# Patient Record
Sex: Male | Born: 1990 | Race: White | Hispanic: No | Marital: Married | Smoking: Former smoker
Health system: Southern US, Community
[De-identification: ages and names within clinical notes are randomized; demographics above are authoritative.]

## PROBLEM LIST (undated history)

## (undated) DIAGNOSIS — F909 Attention-deficit hyperactivity disorder, unspecified type: Secondary | ICD-10-CM

## (undated) HISTORY — PX: NO PAST SURGERIES: SHX2092

---
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
## 2011-08-06 ENCOUNTER — Ambulatory Visit (INDEPENDENT_AMBULATORY_CARE_PROVIDER_SITE_OTHER): Payer: Self-pay | Admitting: Internal Medicine

## 2011-08-06 DIAGNOSIS — F988 Other specified behavioral and emotional disorders with onset usually occurring in childhood and adolescence: Secondary | ICD-10-CM

## 2011-08-13 ENCOUNTER — Ambulatory Visit: Payer: Self-pay | Admitting: Internal Medicine

## 2011-09-06 ENCOUNTER — Ambulatory Visit (INDEPENDENT_AMBULATORY_CARE_PROVIDER_SITE_OTHER): Payer: Managed Care, Other (non HMO)

## 2011-09-06 DIAGNOSIS — R059 Cough, unspecified: Secondary | ICD-10-CM

## 2011-09-06 DIAGNOSIS — R509 Fever, unspecified: Secondary | ICD-10-CM

## 2011-09-06 DIAGNOSIS — R05 Cough: Secondary | ICD-10-CM

## 2012-01-28 ENCOUNTER — Telehealth: Payer: Self-pay

## 2012-01-28 DIAGNOSIS — F909 Attention-deficit hyperactivity disorder, unspecified type: Secondary | ICD-10-CM

## 2012-01-28 NOTE — Telephone Encounter (Signed)
Pt is requesting adderall refill Dr. Merla Riches

## 2012-01-28 NOTE — Telephone Encounter (Signed)
Please pull chart and route to PA for review.

## 2012-01-29 MED ORDER — AMPHETAMINE-DEXTROAMPHETAMINE 15 MG PO TABS: 15.0000 mg | ORAL_TABLET | Freq: Three times a day (TID) | ORAL | Status: DC

## 2012-01-29 NOTE — Telephone Encounter (Signed)
Per Dr. Netta Corrigan last note in 12/13, he wanted pt to call or RTC in 3  Months.  What's plan?

## 2012-01-29 NOTE — Telephone Encounter (Signed)
LMOM that his request if ready for p/up

## 2012-01-29 NOTE — Telephone Encounter (Signed)
Pt stated that he didn't even think about needing a f/up, because he is finally on a combination that seems to be working pretty well for him. He is working two jobs this summer and it is very hard to get in to see Dr Merla Riches right now, but he does get off at 1:00 pm on Weds. I transferred him to appt center to set up appt for next Wed PM appt w/ Dr Merla Riches. Pt does request RFs of Adderall until his appt if possible, and would like to try increasing strength to 20 mg TID instead of 10 mg. The dosing schedule seems to work well for him, but he feels like a slightly higher strength would help him more. Do you want to do this, Dr Merla Riches? Checked EPIC and appt is set for 7/3 @ 2 pm.

## 2012-01-29 NOTE — Telephone Encounter (Signed)
Will increase to 15 mg 3 times a day of regular release Adderall with next appointment on 02/25/2012

## 2012-01-30 ENCOUNTER — Telehealth: Payer: Self-pay

## 2012-01-30 NOTE — Telephone Encounter (Signed)
Patient wants to know if any one has been in contact with his insurance company because they gave him a hard time the last time he tried to get his script filled and if the dosage of his adderall has been increased.

## 2012-01-30 NOTE — Telephone Encounter (Signed)
Spoke with patient, notified that yes... We did increase his Adderall to 15 tid.  He will pick up rx Sunday.  Also let him know that we do not know if we will have to precert new dose yet, it will have to be run through the pharmacy first and then they will contact us.  Pt understood.

## 2012-02-03 ENCOUNTER — Telehealth: Payer: Self-pay

## 2012-02-03 NOTE — Telephone Encounter (Signed)
PT STATES HE HASN'T BEEN ABLE TO GET ALL HIS ADDERALL BECAUSE WE NEED TO CALL THE INSURANCE COMPANY TO AUTHORIZED AN EARLY REFILL PT IS HAVING CLASS THIS EVENING AND REALLY NEED TO TAKE HIS ADDERALL TODAY IF POSSIBLE PLEASE CALL PT AT [phone]   [facility] ON [address]

## 2012-02-04 ENCOUNTER — Telehealth: Payer: Self-pay

## 2012-02-04 NOTE — Telephone Encounter (Addendum)
PATIENT'S MOTHER WANTS TO KNOW IF WE HAVE SENT IN THE NECESSARY PAPERWORK SAYING WHY HER SON HAS TO HAVE 3 DOSES OF ADDDERALL 15MG  INSTEAD ON 2 DOSES? PLEASE CALL TO LET HER KNOW. BEST PHONE [phone] (MOTHER IS KATHY Calvey)   (PHARMACY IS [facility])    MBC

## 2012-02-04 NOTE — Telephone Encounter (Signed)
Called pharmacy and got info to do PA. [employer] did not have a record of pt under that ID #. Called pt and got updated ID #[id number] and cust serv #. Called and completed PA over the phone and received approval for 3 mos only #[id number]. Asked if we would need another PA if strength increased to 20 mg and was told that we will. Notified pt of all of the above. Pt agreed and will call pharmacy to have it filled.

## 2012-02-25 ENCOUNTER — Ambulatory Visit (INDEPENDENT_AMBULATORY_CARE_PROVIDER_SITE_OTHER): Payer: Managed Care, Other (non HMO) | Admitting: Internal Medicine

## 2012-02-25 VITALS — BP 121/66 | HR 65 | Temp 97.9°F | Resp 16 | Ht 70.0 in | Wt 169.9 lb

## 2012-02-25 DIAGNOSIS — F988 Other specified behavioral and emotional disorders with onset usually occurring in childhood and adolescence: Secondary | ICD-10-CM

## 2012-02-25 DIAGNOSIS — F909 Attention-deficit hyperactivity disorder, unspecified type: Secondary | ICD-10-CM | POA: Insufficient documentation

## 2012-02-25 MED ORDER — AMPHETAMINE-DEXTROAMPHETAMINE 20 MG PO TABS: 20.0000 mg | ORAL_TABLET | Freq: Three times a day (TID) | ORAL | Status: DC

## 2012-02-25 NOTE — Progress Notes (Signed)
  Subjective:    Patient ID: Matthew Hamilton, male    DOB: [birth year], 21 y.o.   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 amphetamine-dextroamphetamine (ADDERALL) 20 MG tablet    Sig: Take 1 tablet (20 mg total) by mouth 3 (three) times daily.    Dispense:  90 tablet    Refill:  0  . amphetamine-dextroamphetamine (ADDERALL) 20 MG tablet    Sig: Take 1 tablet (20 mg total) by mouth 3 (three) times daily.    Dispense:  90 tablet    Refill:  0  . amphetamine-dextroamphetamine (ADDERALL) 20 MG tablet    Sig: Take 1 tablet (20 mg total) by mouth 3 (three) times daily.    Dispense:  90 tablet    Refill:  0   Call in 3 months for another 3 prescriptions and followup in 6 months Phone number for [facility] precertification [phone]

## 2012-04-11 ENCOUNTER — Ambulatory Visit (INDEPENDENT_AMBULATORY_CARE_PROVIDER_SITE_OTHER): Payer: Managed Care, Other (non HMO) | Admitting: Internal Medicine

## 2012-04-11 VITALS — BP 108/80 | HR 60 | Temp 97.6°F | Resp 16 | Ht 70.0 in | Wt 168.4 lb

## 2012-04-11 DIAGNOSIS — J029 Acute pharyngitis, unspecified: Secondary | ICD-10-CM

## 2012-04-11 LAB — POCT RAPID STREP A (OFFICE): Rapid Strep A Screen: NEGATIVE

## 2012-04-11 MED ORDER — CEPHALEXIN 500 MG PO CAPS: 500.0000 mg | ORAL_CAPSULE | Freq: Three times a day (TID) | ORAL | Status: DC

## 2012-04-11 NOTE — Progress Notes (Signed)
  Subjective:    Patient ID: Matthew Hamilton, male    DOB: 07/11/1991, 21 y.o.   MRN: [id number]  HPIPresents with 24-hour history of fever and pharyngitis No runny nose/no cough    Review of Systems     Objective:   Physical Exam TMs clear/nares clear Throat red with exudate 1+ a.c. Nodes Chest clear       Results for orders placed in visit on 04/11/12  POCT RAPID STREP A (OFFICE)      Component Value Range   Rapid Strep A Screen Negative  Negative    Assessment & Plan:  Problem #1 pharyngitis Throat culture Start Z-Pak

## 2012-04-13 LAB — CULTURE, GROUP A STREP: Organism ID, Bacteria: NORMAL

## 2012-04-13 NOTE — Telephone Encounter (Signed)
The only thing we can do about an early refill would be for him to use a prescription without using his insurance If his throat is still sore tell him to continue the antibiotic and we can recheck him this weekend on Sunday if he is not well- in the early morning

## 2012-04-13 NOTE — Telephone Encounter (Signed)
Pt states that he still has a bad ST, but no fever. Please advice

## 2012-04-14 NOTE — Telephone Encounter (Signed)
I have advised patient and he will try ibuprofen as well, he has not tried this yet.

## 2012-04-14 NOTE — Telephone Encounter (Signed)
This was attatched to an old message, he does not need early refill. He is asking about his sore throat , I have left message for him to call me back about this.

## 2012-04-15 ENCOUNTER — Telehealth: Payer: Self-pay

## 2012-04-15 NOTE — Telephone Encounter (Signed)
Dr. Merla Riches, patient mother would like for you to give her a call about her son. Didn't give any details. [phone].

## 2012-04-16 NOTE — Telephone Encounter (Signed)
Still has pain is sharp in the left posterior pharynx No fever fatigue No nodes Discussed coxsackievirus as probable etiology If not well within 10-14 days we'll set up nasendoscopy by ENT

## 2012-09-15 ENCOUNTER — Telehealth: Payer: Self-pay

## 2012-09-15 DIAGNOSIS — F988 Other specified behavioral and emotional disorders with onset usually occurring in childhood and adolescence: Secondary | ICD-10-CM

## 2012-09-15 MED ORDER — AMPHETAMINE-DEXTROAMPHETAMINE 20 MG PO TABS: 20.0000 mg | ORAL_TABLET | Freq: Three times a day (TID) | ORAL | Status: DC

## 2012-09-15 NOTE — Telephone Encounter (Signed)
Notified mother that rx's were ready and that pt needs to be seen before further refills.

## 2012-09-15 NOTE — Telephone Encounter (Signed)
Doing well w/out side effects  Meds ordered this encounter  Medications  . amphetamine-dextroamphetamine (ADDERALL) 20 MG tablet    Sig: Take 1 tablet (20 mg total) by mouth 3 (three) times daily. For after 11/13/12    Dispense:  90 tablet    Refill:  0  . amphetamine-dextroamphetamine (ADDERALL) 20 MG tablet    Sig: Take 1 tablet (20 mg total) by mouth 3 (three) times daily. For after 10/16/12    Dispense:  90 tablet    Refill:  0  . amphetamine-dextroamphetamine (ADDERALL) 20 MG tablet    Sig: Take 1 tablet (20 mg total) by mouth 3 (three) times daily.    Dispense:  90 tablet    Refill:  0   Needs f/u before further ref

## 2012-09-15 NOTE — Telephone Encounter (Signed)
Patient's mother would like adderall refill for 3 months that Dr Merla Riches normally does for him.  Best # when ready [phone]

## 2012-10-03 ENCOUNTER — Telehealth: Payer: Self-pay

## 2012-10-03 NOTE — Telephone Encounter (Signed)
I called [facility] and they said we would have to write another script for 10 mg. They do have them in stock now and they also have 30 mgs. Please advise.

## 2012-10-03 NOTE — Telephone Encounter (Signed)
Mom reports there is a shortage on amphetamine-dextroamphetamine (ADDERALL) 20 MG tablet Will check around with various pharmacies; is asking if Dr. Merla Riches will consider rewriting the script for 10 MG to be taken 6 x day or 30 mg to take two x daily  Cbn:  [phone]

## 2012-10-04 MED ORDER — AMPHETAMINE-DEXTROAMPHETAMINE 10 MG PO TABS: 20.0000 mg | ORAL_TABLET | Freq: Three times a day (TID) | ORAL | Status: DC

## 2012-10-04 NOTE — Telephone Encounter (Signed)
Called him to advise Rx at front desk for pick up.

## 2012-10-04 NOTE — Telephone Encounter (Signed)
20mg  unavailable Meds ordered this encounter  Medications  . amphetamine-dextroamphetamine (ADDERALL) 10 MG tablet    Sig: Take 2 tablets (20 mg total) by mouth 3 (three) times daily.    Dispense:  180 tablet    Refill:  0   Has 20 tid for 2/22 and 3/22 if they become available

## 2013-04-05 ENCOUNTER — Telehealth: Payer: Self-pay

## 2013-04-05 NOTE — Telephone Encounter (Signed)
Spoke to mother, she states Rx was written for Adderall and Rx was expired when she took it in to pharmacy. Advised her appt is needed for him has been a year since last visit. She is asking for one month supply please advise.

## 2013-04-05 NOTE — Telephone Encounter (Signed)
Called mother, to see if there is anything further I can add to this note. Left message for her to call me back.

## 2013-04-05 NOTE — Telephone Encounter (Signed)
KATHY WOULD LIKE A CALL BACK FROM DR DOOLITTLE REGARDING HER SON WHEN HE COMES BACK PLEASE CALL [phone]

## 2013-04-06 NOTE — Telephone Encounter (Signed)
Federal regulations have [place] trapped here//can't write without a visit within the year

## 2013-04-07 ENCOUNTER — Telehealth: Payer: Self-pay

## 2013-04-07 NOTE — Telephone Encounter (Signed)
Patient's mom returned call. Please try mom again tomorrow [phone] If between 9-1 call her at work [phone] ext [phone]

## 2013-04-07 NOTE — Telephone Encounter (Signed)
Thanks. I have called mother to advise.  Left message for her to call me back.

## 2013-04-08 NOTE — Telephone Encounter (Signed)
I have called her to advise.  

## 2013-04-08 NOTE — Telephone Encounter (Signed)
Called mother to advise. Left message for her to call me back./

## 2013-05-04 ENCOUNTER — Ambulatory Visit: Payer: Managed Care, Other (non HMO) | Admitting: Internal Medicine

## 2013-05-25 ENCOUNTER — Ambulatory Visit: Payer: Managed Care, Other (non HMO) | Admitting: Internal Medicine

## 2013-06-29 ENCOUNTER — Encounter: Payer: Self-pay | Admitting: Internal Medicine

## 2013-06-29 ENCOUNTER — Ambulatory Visit (INDEPENDENT_AMBULATORY_CARE_PROVIDER_SITE_OTHER): Payer: BC Managed Care – PPO | Admitting: Internal Medicine

## 2013-06-29 VITALS — BP 128/84 | HR 76 | Temp 99.0°F | Resp 17 | Ht 69.0 in | Wt 166.0 lb

## 2013-06-29 DIAGNOSIS — F17200 Nicotine dependence, unspecified, uncomplicated: Secondary | ICD-10-CM

## 2013-06-29 DIAGNOSIS — F988 Other specified behavioral and emotional disorders with onset usually occurring in childhood and adolescence: Secondary | ICD-10-CM

## 2013-06-29 MED ORDER — AMPHETAMINE-DEXTROAMPHETAMINE 20 MG PO TABS: 20.0000 mg | ORAL_TABLET | Freq: Three times a day (TID) | ORAL | Status: DC

## 2013-06-29 NOTE — Progress Notes (Signed)
  Subjective:    Patient ID: Matthew Hamilton, male    DOB: 04/28/1991, 22 y.o.   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 Constitutional: He is oriented to person, place, and time. He appears well-developed and well-nourished. No distress.  HENT:  Head: Normocephalic and atraumatic.  Eyes: Pupils are equal, round, and reactive to light.  Neck: Normal range of motion.  Cardiovascular: Normal rate and regular rhythm.   Pulmonary/Chest: Effort normal. No respiratory distress.  Musculoskeletal: Normal range of motion.  Neurological: He is alert and oriented to person, place, and time.  Skin: Skin is warm and dry.  Psychiatric: He has a normal mood and affect. His behavior is normal.   BP 128/84  Pulse 76  Temp(Src) 99 F (37.2 C) (Oral)  Resp 17  Ht 5\' 9"  (1.753 m)  Wt 166 lb (75.297 kg)  BMI 24.50 kg/m2  SpO2 98%        Assessment & Plan:  ADD (attention deficit disorder) - Plan: amphetamine-dextroamphetamine (ADDERALL) 20 MG tablet, amphetamine-dextroamphetamine (ADDERALL) 20 MG tablet  Smoker  Meds ordered this encounter  Medications  . amphetamine-dextroamphetamine (ADDERALL) 20 MG tablet    Sig: Take 1 tablet (20 mg total) by mouth 3 (three) times daily. For after 10/16/12    Dispense:  90 tablet    Refill:  0  . amphetamine-dextroamphetamine (ADDERALL) 20 MG tablet    Sig: Take 1 tablet (20 mg total) by mouth 3 (three) times daily. For after 11/13/12    Dispense:  90 tablet    Refill:  0  . amphetamine-dextroamphetamine (ADDERALL) 20 MG tablet    Sig: Take 1 tablet (20 mg total) by mouth 3 (three) times daily.    Dispense:  90 tablet    Refill:  0   Call 3/f-u 6

## 2013-07-01 ENCOUNTER — Encounter: Payer: Self-pay | Admitting: Internal Medicine

## 2013-07-01 DIAGNOSIS — Z87891 Personal history of nicotine dependence: Secondary | ICD-10-CM | POA: Insufficient documentation

## 2013-10-23 ENCOUNTER — Encounter (HOSPITAL_COMMUNITY): Payer: Self-pay

## 2013-10-23 ENCOUNTER — Ambulatory Visit (HOSPITAL_COMMUNITY)
Admission: RE | Admit: 2013-10-23 | Discharge: 2013-10-23 | Disposition: A | Discharge status: Home / Self Care | Payer: BC Managed Care – PPO | Source: Ambulatory Visit | Attending: Emergency Medicine | Admitting: Emergency Medicine

## 2013-10-23 ENCOUNTER — Ambulatory Visit (INDEPENDENT_AMBULATORY_CARE_PROVIDER_SITE_OTHER): Payer: BC Managed Care – PPO | Admitting: Emergency Medicine

## 2013-10-23 VITALS — BP 125/72 | HR 61 | Temp 98.1°F | Resp 18 | Wt 171.0 lb

## 2013-10-23 DIAGNOSIS — S0990XA Unspecified injury of head, initial encounter: Secondary | ICD-10-CM | POA: Insufficient documentation

## 2013-10-23 DIAGNOSIS — S060X9A Concussion with loss of consciousness of unspecified duration, initial encounter: Secondary | ICD-10-CM

## 2013-10-23 DIAGNOSIS — R51 Headache: Secondary | ICD-10-CM

## 2013-10-23 DIAGNOSIS — H538 Other visual disturbances: Secondary | ICD-10-CM | POA: Insufficient documentation

## 2013-10-23 DIAGNOSIS — W19XXXA Unspecified fall, initial encounter: Secondary | ICD-10-CM | POA: Insufficient documentation
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
## 2013-12-21 ENCOUNTER — Encounter: Payer: Self-pay | Admitting: Internal Medicine

## 2013-12-21 ENCOUNTER — Ambulatory Visit (INDEPENDENT_AMBULATORY_CARE_PROVIDER_SITE_OTHER): Payer: BC Managed Care – PPO | Admitting: Internal Medicine

## 2013-12-21 VITALS — BP 120/66 | HR 65 | Temp 98.1°F | Resp 16 | Ht 69.0 in | Wt 170.2 lb

## 2013-12-21 DIAGNOSIS — F988 Other specified behavioral and emotional disorders with onset usually occurring in childhood and adolescence: Secondary | ICD-10-CM

## 2013-12-21 DIAGNOSIS — S39012A Strain of muscle, fascia and tendon of lower back, initial encounter: Secondary | ICD-10-CM

## 2013-12-21 DIAGNOSIS — R209 Unspecified disturbances of skin sensation: Secondary | ICD-10-CM

## 2013-12-21 DIAGNOSIS — S335XXA Sprain of ligaments of lumbar spine, initial encounter: Secondary | ICD-10-CM

## 2013-12-21 DIAGNOSIS — R202 Paresthesia of skin: Secondary | ICD-10-CM

## 2013-12-21 MED ORDER — MELOXICAM 15 MG PO TABS: 15.0000 mg | ORAL_TABLET | Freq: Every day | ORAL | Status: DC

## 2013-12-21 MED ORDER — AMPHETAMINE-DEXTROAMPHETAMINE 20 MG PO TABS
20.0000 mg | ORAL_TABLET | Freq: Three times a day (TID) | ORAL | Status: DC
Start: 2013-12-21 — End: 2014-04-12

## 2013-12-21 MED ORDER — AMPHETAMINE-DEXTROAMPHETAMINE 20 MG PO TABS: 20.0000 mg | ORAL_TABLET | Freq: Three times a day (TID) | ORAL | Status: DC

## 2013-12-21 NOTE — Patient Instructions (Signed)
PT Matthew Hamilton
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
## 2014-01-27 ENCOUNTER — Ambulatory Visit: Payer: BC Managed Care – PPO | Admitting: Family Medicine

## 2014-02-01 ENCOUNTER — Ambulatory Visit: Payer: BC Managed Care – PPO | Admitting: Family Medicine

## 2014-02-15 ENCOUNTER — Ambulatory Visit (INDEPENDENT_AMBULATORY_CARE_PROVIDER_SITE_OTHER): Payer: BC Managed Care – PPO | Admitting: Family Medicine

## 2014-02-15 ENCOUNTER — Encounter: Payer: Self-pay | Admitting: Family Medicine

## 2014-02-15 ENCOUNTER — Inpatient Hospital Stay
Admission: RE | Admit: 2014-02-15 | Discharge: 2014-02-15 | Disposition: A | Discharge status: Home / Self Care | Payer: Self-pay | Source: Ambulatory Visit | Attending: Family Medicine | Admitting: Family Medicine

## 2014-02-15 ENCOUNTER — Ambulatory Visit (INDEPENDENT_AMBULATORY_CARE_PROVIDER_SITE_OTHER): Payer: BC Managed Care – PPO

## 2014-02-15 VITALS — BP 120/68 | HR 68 | Temp 98.5°F | Resp 16 | Ht 69.0 in | Wt 172.6 lb

## 2014-02-15 DIAGNOSIS — M25561 Pain in right knee: Secondary | ICD-10-CM

## 2014-02-15 DIAGNOSIS — M25569 Pain in unspecified knee: Secondary | ICD-10-CM

## 2014-02-15 DIAGNOSIS — M25562 Pain in left knee: Secondary | ICD-10-CM

## 2014-02-15 NOTE — Progress Notes (Signed)
S:  This 23 y.o. Cauc male is here for evaluation of knee pain; R knee pain >> L knee. Pt has hx of MCL injury while playing soccer @ [employer]. He cannot recall which knee was injured but had improvement w/ PT. He states R knee began to hurt while he was exercsing (doing squats); knee felt weak and he had aching pain along inner aspect of joint. He moved to weight machine, trying to do extensions and knee felt weak w/ increased pain. Denies swelling or crepitus. He does endorse instability of R knee. He can bear weight and ambulate. L knee has some discomfort but he attributes that to bowling last night.  Pt takes Meloxicam for back pain; this med does not relieve knee pain.  Patient Active Problem List   Diagnosis Date Noted  . Smoker 07/01/2013  . ADHD (attention deficit hyperactivity disorder) 02/25/2012   Prior to Admission medications   Medication Sig Start Date End Date Taking? Authorizing Provider  amphetamine-dextroamphetamine (ADDERALL) 20 MG tablet Take 1 tablet (20 mg total) by mouth 3 (three) times daily. For 30 d after signed 12/21/13  Yes Tonye Pearsonobert P Doolittle, MD  amphetamine-dextroamphetamine (ADDERALL) 20 MG tablet Take 1 tablet (20 mg total) by mouth 3 (three) times daily. 12/21/13  Yes Tonye Pearsonobert P Doolittle, MD  amphetamine-dextroamphetamine (ADDERALL) 20 MG tablet Take 1 tablet (20 mg total) by mouth 3 (three) times daily. For 60d after signed 12/21/13  Yes Tonye Pearsonobert P Doolittle, MD  meloxicam (MOBIC) 15 MG tablet Take 1 tablet (15 mg total) by mouth daily. 12/21/13  Yes Tonye Pearsonobert P Doolittle, MD   PMHx, Surg Hx, Soc and Fam HX reviewed.  ROS: As per HPI.  O: Filed Vitals:   02/15/14 1120  BP: 120/68  Pulse: 68  Temp: 98.5 F (36.9 C)  Resp: 16   GEN: In NAD; WN,WD. HENT: [place]/AT; EOMI w/ clear conj/sclerae. Otherwise unremarkable. COR: RRR. LUNGS: Normal resp rate and effort. SKIN: W&D; intact w/o erythema, rashes or pallor. MS: R knee- normal appearance and normal  alignment; no effusion or deformity. Tender medial aspect at tibial plateau and joint line. + patella compression. No joint laxity. No fullness in popliteal space. L knee normal- normal appearance/ alignment and ROM. NEURO: A&O x 3; CNs intact. Gait normal. Nonfocal.   [facility] reading (PRIMARY) by  Dr. Audria NineMcPherson: R knee- normal appearance with well- preserved joint space. No fracture or dislocation.  A/P: Acute knee pain, right - Plan: DG Knee 1-2 Views Right, DG Knee 1-2 Views Right, Ambulatory referral to Orthopedic Surgery. Pt provided w/ a hinged knee brace.  Knee pain, bilateral - Plan: Ambulatory referral to Orthopedic Surgery

## 2014-02-15 NOTE — Patient Instructions (Signed)
I have ordered a referral to an ORTHOPEDIC practice for further evaluation of knee pain, especially right knee pain with instability and history of MCL injury. The knee brace should provide some support until you can see the specialist.

## 2014-02-16 ENCOUNTER — Telehealth: Payer: Self-pay

## 2014-02-16 DIAGNOSIS — M25561 Pain in right knee: Secondary | ICD-10-CM

## 2014-02-16 NOTE — Telephone Encounter (Signed)
I will order the MRI of right knee [facility].

## 2014-02-16 NOTE — Telephone Encounter (Signed)
Matthew Hamilton patients mother is confused says that [facility]. Needs orders from Dr. Audria NineMcPherson on getting a patient an MRI on his knee ([facility] says it can take two weeks to get scheduled). Please call mother back. Patient was [facility] (referral sent today- they will contact patient for appointment-per [facility]) . I asked her to speak to son on what he wants to do. Matthew Hamilton says its urgent because the insurance is only covered till 03/2014.   Please advise; she would like to talk to someone today   Matthew Hamilton: [phone]; mother is on [place]

## 2014-02-16 NOTE — Telephone Encounter (Signed)
Pts mother would like to know if an MRI is going to be [facility] by [facility] or by Dr. Althea CharonMcKinley at the time of the visit? She is very concerned due to her sons insurance  ending in august.  Please advise.

## 2014-02-17 ENCOUNTER — Telehealth: Payer: Self-pay

## 2014-02-17 NOTE — Telephone Encounter (Signed)
Spoke to mother- advised of MRI.

## 2014-02-17 NOTE — Telephone Encounter (Signed)
To expedite the review of your request, contact [facility] at [phone] to find out what additional information is needed or if the ordering provider would like to discuss this case with a physician reviewer.         Order Request Summary     Request Status:  In Progress                     Health Plan: [employer]               Case Due to Close On/Before:  02/21/2014         Scheduled Date of Service:  02/17/2014                    Member Information: Richins,Gill Member #:[id number]#:[id number] [address] [id number] Date of Birth:1991-01-10 Phone:   Ordering Provider:  Maurice MarchMCPHERSON,BARBARA B [address] [id number] [phone] Fax:[phone] NPI:[phone]   Imaging Facility:      [facility] [address]  ,[place] Phone:[phone] Fax: [id number]                      Exam Information:               The information below was obtained from the Ordering Provider and has not been independently verified by AIM. AIM assumes no responsibility for the accuracy of this information or for its consistency with the patient's medical record.         SUMMARY OF EXAMS (1)       Exam   Exam Request Status         Lower Extremity Joint/Nonjoint - MRI   Without Contrast          Review ExamWithdraw Exam                                                                             = Multiple Decisions Rendered                     -------------------------------------------------------------------------------- The issuance of an Order ID is not a guarantee of benefits; payment is subject to the member's eligibility and plan provisions in effect at the time of  service. --------------------------------------------------------------------------------                                                                             [employer] and [employer] [place] is an Armed forces technical officerindependent licensee of the [employer] and [employer].                   Have a comment or suggestion?     Copyright  2012, [facility], All Rights Reserved.                              --------------------------------------------------------------------------------

## 2014-02-20 NOTE — Telephone Encounter (Signed)
Sent referral to [facility] with the authorization number

## 2014-02-20 NOTE — Telephone Encounter (Signed)
#   [id number] Valid for 30 days beginning 02/20/2014

## 2014-02-23 ENCOUNTER — Telehealth: Payer: Self-pay

## 2014-02-23 NOTE — Telephone Encounter (Signed)
The most reasonable solution would have been to [facility] problems with both knees while in the office. Was this discussed with the specialist at the time of the visit? I do not do joint injections. Patient /mother can contact ORTHo and inquire if he can have other knee evaluated; an injection can be administered by [facility] if warranted.

## 2014-02-23 NOTE — Telephone Encounter (Signed)
Please advise 

## 2014-02-23 NOTE — Telephone Encounter (Signed)
Pt's mother called in and states her son was seen Dr Audria NineMcPherson for his knees last week and she referred them to an orthopedic. They went to see them and he received a cortisone shot and it really helped and the mother is wanting to know if Dr Audria NineMcPherson would give him one in the other knee. She can be reached [phone]. Thank you

## 2014-02-24 NOTE — Telephone Encounter (Signed)
Advised mother that patient needs to rtn to Ortho for treatment. She stated she understood.

## 2014-03-01 ENCOUNTER — Ambulatory Visit
Admission: RE | Admit: 2014-03-01 | Discharge: 2014-03-01 | Disposition: A | Discharge status: Home / Self Care | Payer: BC Managed Care – PPO | Source: Ambulatory Visit | Attending: Family Medicine | Admitting: Family Medicine

## 2014-03-01 DIAGNOSIS — M25561 Pain in right knee: Secondary | ICD-10-CM

## 2014-03-09 ENCOUNTER — Telehealth: Payer: Self-pay | Admitting: Family Medicine

## 2014-03-09 NOTE — Telephone Encounter (Signed)
[facility]; MRI remarkable for tendinosis. Pt going to PT ofr 10 sessions to strengthen and improve alignment of patella tendon.

## 2014-04-05 ENCOUNTER — Ambulatory Visit: Payer: BC Managed Care – PPO | Admitting: Internal Medicine

## 2014-04-12 ENCOUNTER — Encounter: Payer: Self-pay | Admitting: Internal Medicine

## 2014-04-12 ENCOUNTER — Ambulatory Visit (INDEPENDENT_AMBULATORY_CARE_PROVIDER_SITE_OTHER): Payer: BC Managed Care – PPO | Admitting: Internal Medicine

## 2014-04-12 VITALS — BP 124/72 | HR 70 | Temp 98.8°F | Resp 16 | Ht 69.5 in | Wt 176.8 lb

## 2014-04-12 DIAGNOSIS — F909 Attention-deficit hyperactivity disorder, unspecified type: Secondary | ICD-10-CM

## 2014-04-12 DIAGNOSIS — F988 Other specified behavioral and emotional disorders with onset usually occurring in childhood and adolescence: Secondary | ICD-10-CM

## 2014-04-12 DIAGNOSIS — F900 Attention-deficit hyperactivity disorder, predominantly inattentive type: Secondary | ICD-10-CM

## 2014-04-12 MED ORDER — AMPHETAMINE-DEXTROAMPHETAMINE 20 MG PO TABS: 20.0000 mg | ORAL_TABLET | Freq: Three times a day (TID) | ORAL | Status: DC
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
## 2014-06-28 ENCOUNTER — Ambulatory Visit: Payer: BC Managed Care – PPO | Admitting: Internal Medicine

## 2014-09-06 ENCOUNTER — Ambulatory Visit: Payer: BC Managed Care – PPO | Admitting: Internal Medicine

## 2015-07-23 ENCOUNTER — Encounter: Payer: Self-pay | Admitting: Internal Medicine

## 2015-08-17 ENCOUNTER — Ambulatory Visit (INDEPENDENT_AMBULATORY_CARE_PROVIDER_SITE_OTHER): Payer: Commercial Managed Care - HMO | Admitting: Internal Medicine

## 2015-08-17 ENCOUNTER — Encounter: Payer: Self-pay | Admitting: Internal Medicine

## 2015-08-17 VITALS — BP 105/60 | HR 72 | Temp 98.0°F | Resp 16 | Ht 70.0 in | Wt 182.0 lb

## 2015-08-17 DIAGNOSIS — F900 Attention-deficit hyperactivity disorder, predominantly inattentive type: Secondary | ICD-10-CM

## 2015-08-17 DIAGNOSIS — F988 Other specified behavioral and emotional disorders with onset usually occurring in childhood and adolescence: Secondary | ICD-10-CM

## 2015-08-17 MED ORDER — AMPHETAMINE-DEXTROAMPHETAMINE 20 MG PO TABS: 20.0000 mg | ORAL_TABLET | Freq: Three times a day (TID) | ORAL | Status: DC

## 2015-08-17 NOTE — Progress Notes (Signed)
   Subjective:    Patient ID: Matthew Hamilton, male    DOB: 04/28/1991, 24 y.o.   MRN: [id number]  HPIf/u ADD (attention deficit disorder) -doing well!! Moved to new [employer] and has more responsibility--moving up in the company Likes staying active at work!! 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 He appears well-developed and well-nourished. No distress.  HENT:  Head: Normocephalic and atraumatic.  Eyes: Pupils are equal, round, and reactive to light.  Neck: Normal range of motion.  Cardiovascular: Normal rate and regular rhythm.   Pulmonary/Chest: Effort normal. No respiratory distress.  Musculoskeletal: Normal range of motion.  Neurological: He is alert and oriented to person, place, and time.  Skin: Skin is warm and dry.  Psychiatric: He has a normal mood and affect. His behavior is normal.  Nursing note and vitals reviewed.  BP 105/60 mmHg  Pulse 72  Temp(Src) 98 F (36.7 C)  Resp 16  Ht 5\' 10"  (1.778 m)  Wt 182 lb (82.555 kg)  BMI 26.11 kg/m2        Wt Readings from Last 3 Encounters:  08/17/15 182 lb (82.555 kg)  04/12/14 176 lb 12.8 oz (80.196 kg)  02/15/14 172 lb 9.6 oz (78.291 kg)    Assessment & Plan:  ADD Meds ordered this encounter  Medications  . amphetamine-dextroamphetamine (ADDERALL) 20 MG tablet    Sig: Take 1 tablet (20 mg total) by mouth 3 (three) times daily. For 30 d after signed    Dispense:  90 tablet    Refill:  0  . amphetamine-dextroamphetamine (ADDERALL) 20 MG tablet    Sig: Take 1 tablet (20 mg total) by mouth 3 (three) times daily.    Dispense:  90 tablet    Refill:  0  . amphetamine-dextroamphetamine (ADDERALL) 20 MG tablet    Sig: Take 1 tablet (20 mg total) by mouth 3 (three) times daily. For 60d after signed    Dispense:  90 tablet    Refill:  0     Call 58mJackFrDub MikAden61moJackDeDub MikBettle37moJackRoDub MikHillcrest Height62moJackPDub MikWentwort21moJackFruit CDub MikPear2moJackLismDub MikSellersvill46moJackCrDub MikDeep Rive39moJackNatalbDub MikGoodlan55moJackBloomsbDub MikSulphur Spring20moJackHilshire VillDub MikEstell56moJackWest PDub MikGardne52moJackShell RiDub MikSmithwic3moJackDubDub MikLynnvill52moJackNislDub MikWaterlo33moJackHewDub MikClarit20moJackBargaintDub MikPark Forest Villag51moJackSt. AnthDub MikMillstad67moJackTaylor SpriDub MikHazelto54moJackBelfDub MikPoydra32moJackHoDub MikDenham Spring14moJackTatDub MikEl Castill47moJackKapDub MikArvinsau Poling Weber

## 2015-08-20 ENCOUNTER — Telehealth: Payer: Self-pay

## 2015-08-20 NOTE — Telephone Encounter (Signed)
Coverage approved through 08/19/16. 

## 2015-08-20 NOTE — Telephone Encounter (Signed)
PA approved for generic adderall 20 mg TID on covermymeds, case # [id number]. Notified pharm.

## 2015-10-14 ENCOUNTER — Telehealth: Payer: Self-pay | Admitting: Internal Medicine

## 2015-10-14 NOTE — Telephone Encounter (Signed)
Patient mother stated Dr. Merla Riches put the same date on all three prescriptions and patient is unable to fill the medication. Medication is Adderall 20 MG. Please call patient at [phone].

## 2015-10-16 NOTE — Telephone Encounter (Signed)
Spoke with patients Mom and let her know that each prescription says 30 days and 60 days later.  She will try and get them filled.   If she has any problems, she will call us back.

## 2018-06-22 ENCOUNTER — Emergency Department (HOSPITAL_COMMUNITY)
Admission: EM | Admit: 2018-06-22 | Discharge: 2018-06-22 | Disposition: A | Discharge status: Home / Self Care | Payer: BLUE CROSS/BLUE SHIELD | Attending: Emergency Medicine | Admitting: Emergency Medicine

## 2018-06-22 ENCOUNTER — Emergency Department (HOSPITAL_COMMUNITY): Payer: BLUE CROSS/BLUE SHIELD

## 2018-06-22 ENCOUNTER — Encounter (HOSPITAL_COMMUNITY): Payer: Self-pay | Admitting: Emergency Medicine

## 2018-06-22 DIAGNOSIS — S20212A Contusion of left front wall of thorax, initial encounter: Secondary | ICD-10-CM | POA: Insufficient documentation

## 2018-06-22 DIAGNOSIS — S161XXA Strain of muscle, fascia and tendon at neck level, initial encounter: Secondary | ICD-10-CM | POA: Diagnosis not present

## 2018-06-22 DIAGNOSIS — Y92410 Unspecified street and highway as the place of occurrence of the external cause: Secondary | ICD-10-CM | POA: Insufficient documentation

## 2018-06-22 DIAGNOSIS — R109 Unspecified abdominal pain: Secondary | ICD-10-CM | POA: Insufficient documentation

## 2018-06-22 DIAGNOSIS — Y9389 Activity, other specified: Secondary | ICD-10-CM | POA: Insufficient documentation

## 2018-06-22 DIAGNOSIS — S0990XA Unspecified injury of head, initial encounter: Secondary | ICD-10-CM | POA: Diagnosis present

## 2018-06-22 DIAGNOSIS — Y999 Unspecified external cause status: Secondary | ICD-10-CM | POA: Insufficient documentation

## 2018-06-22 HISTORY — DX: Attention-deficit hyperactivity disorder, unspecified type: F90.9

## 2018-06-22 LAB — CBC WITH DIFFERENTIAL/PLATELET
ABS IMMATURE GRANULOCYTES: 0.06 10*3/uL (ref 0.00–0.07)
BASOS ABS: 0.1 10*3/uL (ref 0.0–0.1)
Basophils Relative: 0 %
Eosinophils Absolute: 0.1 10*3/uL (ref 0.0–0.5)
Eosinophils Relative: 1 %
HEMATOCRIT: 45.7 % (ref 39.0–52.0)
HEMOGLOBIN: 14.7 g/dL (ref 13.0–17.0)
IMMATURE GRANULOCYTES: 0 %
LYMPHS ABS: 2.3 10*3/uL (ref 0.7–4.0)
LYMPHS PCT: 16 %
MCH: 27.2 pg (ref 26.0–34.0)
MCHC: 32.2 g/dL (ref 30.0–36.0)
MCV: 84.6 fL (ref 80.0–100.0)
Monocytes Absolute: 1.2 10*3/uL — ABNORMAL HIGH (ref 0.1–1.0)
Monocytes Relative: 8 %
NEUTROS ABS: 10.1 10*3/uL — AB (ref 1.7–7.7)
NEUTROS PCT: 75 %
NRBC: 0 % (ref 0.0–0.2)
Platelets: 328 10*3/uL (ref 150–400)
RBC: 5.4 MIL/uL (ref 4.22–5.81)
RDW: 12.2 % (ref 11.5–15.5)
WBC: 13.7 10*3/uL — AB (ref 4.0–10.5)

## 2018-06-22 LAB — TYPE AND SCREEN
ABO/RH(D): O POS
Antibody Screen: NEGATIVE

## 2018-06-22 LAB — COMPREHENSIVE METABOLIC PANEL
ALBUMIN: 4.2 g/dL (ref 3.5–5.0)
ALK PHOS: 52 U/L (ref 38–126)
ALT: 47 U/L — ABNORMAL HIGH (ref 0–44)
ANION GAP: 8 (ref 5–15)
AST: 31 U/L (ref 15–41)
BILIRUBIN TOTAL: 0.8 mg/dL (ref 0.3–1.2)
BUN: 16 mg/dL (ref 6–20)
CALCIUM: 9 mg/dL (ref 8.9–10.3)
CO2: 26 mmol/L (ref 22–32)
Chloride: 106 mmol/L (ref 98–111)
Creatinine, Ser: 1.14 mg/dL (ref 0.61–1.24)
GFR calc Af Amer: >60 mL/min (ref >60)
GFR calc non Af Amer: >60 mL/min (ref >60)
GLUCOSE: 102 mg/dL — AB (ref 70–99)
Potassium: 3.2 mmol/L — ABNORMAL LOW (ref 3.5–5.1)
SODIUM: 140 mmol/L (ref 135–145)
TOTAL PROTEIN: 7 g/dL (ref 6.5–8.1)

## 2018-06-22 LAB — ABO/RH: ABO/RH(D): O POS

## 2018-06-22 MED ORDER — IOHEXOL 300 MG/ML  SOLN
100.0000 mL | Freq: Once | INTRAMUSCULAR | Status: AC | PRN
  Administered 2018-06-22: 100 mL via INTRAVENOUS

## 2018-06-22 MED ORDER — MELOXICAM 7.5 MG PO TABS: 15.0000 mg | ORAL_TABLET | Freq: Every day | ORAL | 0 refills | Status: DC

## 2018-06-22 MED ORDER — METHOCARBAMOL 500 MG PO TABS: 500.0000 mg | ORAL_TABLET | Freq: Two times a day (BID) | ORAL | 0 refills | Status: DC | PRN

## 2018-06-22 NOTE — ED Notes (Signed)
C-Collar applied

## 2018-06-22 NOTE — ED Notes (Signed)
Pt self removed c-collar, refused to re apply. Education  Provided, continues to refuse

## 2018-06-22 NOTE — Discharge Instructions (Signed)
Your testing revealed no signs of broken bones or internal injury.  You will likely be very sore for the next several days.  Please take Mobic twice a day as needed, take this no longer than 2 weeks.  Robaxin, 3 times a day as needed for muscle spasms, cold compresses, rest, see your doctor in 2 days for recheck
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
## 2018-06-22 NOTE — ED Notes (Signed)
E-signature not available, pt verbalized understanding of DC instructions and prescriptions 

## 2018-06-30 ENCOUNTER — Encounter: Payer: Self-pay | Admitting: Neurology

## 2018-07-01 ENCOUNTER — Telehealth: Payer: Self-pay
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
## 2018-07-12 ENCOUNTER — Encounter: Payer: Self-pay | Admitting: Family Medicine

## 2018-07-12 ENCOUNTER — Ambulatory Visit: Payer: Commercial Managed Care - HMO | Admitting: Emergency Medicine

## 2018-07-12 ENCOUNTER — Other Ambulatory Visit: Payer: Self-pay

## 2018-07-12 ENCOUNTER — Ambulatory Visit: Payer: BLUE CROSS/BLUE SHIELD | Admitting: Family Medicine

## 2018-07-12 ENCOUNTER — Telehealth: Payer: Self-pay

## 2018-07-12 DIAGNOSIS — S76301A Unspecified injury of muscle, fascia and tendon of the posterior muscle group at thigh level, right thigh, initial encounter: Secondary | ICD-10-CM

## 2018-07-12 DIAGNOSIS — R109 Unspecified abdominal pain: Secondary | ICD-10-CM | POA: Diagnosis not present

## 2018-07-12 MED ORDER — NITROGLYCERIN 0.2 MG/HR TD PT24: MEDICATED_PATCH | TRANSDERMAL | 1 refills | Status: DC

## 2018-07-12 MED ORDER — VITAMIN D (ERGOCALCIFEROL) 1.25 MG (50000 UNIT) PO CAPS: 50000.0000 [IU] | ORAL_CAPSULE | ORAL | 0 refills | Status: DC

## 2018-07-12 MED FILL — VIT D2 1.25 MG (50,000 UNIT: 1.25 MG | 84 days supply | Qty: 12 | Fill #0

## 2018-07-12 MED FILL — NITROGLYCERIN 0.2 MG/HR PTC: 0.2 | 40 days supply | Qty: 10 | Fill #0

## 2018-07-12 NOTE — Telephone Encounter (Signed)
Copied from CRM [id number]. Topic: General - Other >> Jul 12, 2018 10:02 AM Herby AbrahamJohnson, Shiquita C wrote: Reason for CRM: pt says that he was seen by Dr. Katrinka BlazingSmith, pt was suggested something otc by the provider that he couldn't take longer then a month, pt is unsure of with provider suggested. Please advise.   CB: [phone]

## 2018-07-12 NOTE — Patient Instructions (Addendum)
Good to see you  Ice 20 minutes 2 times daily. Usually after activity and before bed. pennsaid pinkie amount topically 2 times daily as needed.  Thigh compression sleeve Exercises 3 times a week.  Once weekly vitamin D for 12 weeks Nitroglycerin Protocol   Apply 1/4 nitroglycerin patch to affected area daily.  Change position of patch within the affected area every 24 hours.  You may experience a headache during the first 1-2 weeks of using the patch, these should subside.  If you experience headaches after beginning nitroglycerin patch treatment, you may take your preferred over the counter pain reliever.  Another side effect of the nitroglycerin patch is skin irritation or rash related to patch adhesive.  Please notify our office if you develop more severe headaches or rash, and stop the patch.  Tendon healing with nitroglycerin patch may require 12 to 24 weeks depending on the extent of injury.  Men should not use if taking Viagra, Cialis, or Levitra.   Do not use if you have migraines or rosacea.  Avoid any jumping, sprinting for now  See me again in 4-5 weeks to make sure doing well

## 2018-07-12 NOTE — Assessment & Plan Note (Signed)
Home exercise, icing, topical anti-inflammatories, compression.  Follow-up again in 4 weeks

## 2018-07-12 NOTE — Assessment & Plan Note (Signed)
Appears to be over the 11th rib.  Possible callus formation.  We discussed x-rays but would not change management.  No true associated hematoma filtering any type of splenic injury.  Patient does not have any bowel or bladder incontinence exam concerning for any intra-abdominal problems either.  Patient is more than 3 weeks out from the injury.  Follow-up with me again in 3 to 4 weeks otherwise.

## 2018-07-12 NOTE — Telephone Encounter (Signed)
Per a verbal from Dr. Katrinka BlazingSmith the medication that he recommends for patient is K2.

## 2018-08-12 ENCOUNTER — Encounter: Payer: Self-pay | Admitting: Gastroenterology

## 2018-08-15 NOTE — Progress Notes (Signed)
Matthew Hamilton D.O. [facility] [address] [place], [place] [address] Phone: [phone] Subjective:    I Ronelle NighKana Thompson am serving as a Neurosurgeonscribe for Dr. Antoine PrimasZachary Felica Chargois.   CC: Hamstring, back pain and knee pain  ION:[id number]:Subjective  Matthew HewsChristopher A Hamilton is a 27 y.o. male coming in with complaint of left upper quadrant pain and right hamstring pain. States that the hamstring is doing well. Left knee is now bothering him (compensation). Rib feels the same.   Right hamstring feels about 99% better.  Still some mild tightness but nothing severe.  Continues to have pain though on the side.  States it is a dull, throbbing aching pain.  Patient states that it seems to do okay for a while and then some severe pain.  Also having the left knee pain.  Patient states it feels like he is contributing for the of contralateral hamstring.  Has had trouble with this knee previously.  Feels like it is on the anterior aspect of the knee.  Sometimes feels like a slipping sensation.  Denies any locking or giving out though.     Past Medical History:  Diagnosis Date  . ADHD    No past surgical history on file. Social History   Socioeconomic History  . Marital status: Married    Spouse name: Not on file  . Number of children: Not on file  . Years of education: Not on file  . Highest education level: Not on file  Occupational History  . Not on file  Social Needs  . Financial resource strain: Not on file  . Food insecurity:    Worry: Not on file    Inability: Not on file  . Transportation needs:    Medical: Not on file    Non-medical: Not on file  Tobacco Use  . Smoking status: Current Every Day Smoker    Packs/day: 0.50    Years: 1.00    Pack years: 0.50    Types: Cigarettes  . Smokeless tobacco: Never Used  Substance and Sexual Activity  . Alcohol use: Yes  . Drug use: Never  . Sexual activity: Not on file  Lifestyle  . Physical activity:    Days per week: Not on file   Minutes per session: Not on file  . Stress: Not on file  Relationships  . Social connections:    Talks on phone: Not on file    Gets together: Not on file    Attends religious service: Not on file    Active member of club or organization: Not on file    Attends meetings of clubs or organizations: Not on file    Relationship status: Not on file  Other Topics Concern  . Not on file  Social History Narrative  . Not on file   Allergies  Allergen Reactions  . Amoxicillin   . Chlorine   . Codeine   . Penicillins    No family history on file.   Current Outpatient Medications (Cardiovascular):  .  nitroGLYCERIN (NITRODUR - DOSED IN MG/24 HR) 0.2 mg/hr patch, 1/4 patch daily   Current Outpatient Medications (Analgesics):  .  meloxicam (MOBIC) 7.5 MG tablet, Take 2 tablets (15 mg total) by mouth daily.   Current Outpatient Medications (Other):  .  amphetamine-dextroamphetamine (ADDERALL) 20 MG tablet, Take 1 tablet (20 mg total) by mouth 3 (three) times daily. For 30 d after signed .  amphetamine-dextroamphetamine (ADDERALL) 20 MG tablet, Take 1 tablet (20 mg total) by  mouth 3 (three) times daily. Marland Kitchen.  amphetamine-dextroamphetamine (ADDERALL) 20 MG tablet, Take 1 tablet (20 mg total) by mouth 3 (three) times daily. For 60d after signed .  methocarbamol (ROBAXIN) 500 MG tablet, Take 1 tablet (500 mg total) by mouth 2 (two) times daily as needed for muscle spasms. .  Vitamin D, Ergocalciferol, (DRISDOL) 1.25 MG (50000 UT) CAPS capsule, Take 1 capsule (50,000 Units total) by mouth every 7 (seven) days. Marland Kitchen.  gabapentin (NEURONTIN) 100 MG capsule, Take 2 capsules (200 mg total) by mouth at bedtime.    Past medical history, social, surgical and family history all reviewed in electronic medical record.  No pertanent information unless stated regarding to the chief complaint.   Review of Systems:  No headache, visual changes, nausea, vomiting, diarrhea, constipation, dizziness, abdominal  pain, skin rash, fevers, chills, night sweats, weight loss, swollen lymph nodes, body aches, joint swelling, chest pain, shortness of breath, mood changes.  Positive muscle aches  Objective  Blood pressure 120/78, pulse 62, height 5\' 10"  (1.778 m), weight 192 lb (87.1 kg), SpO2 98 %.   General: No apparent distress alert and oriented x3 mood and affect normal, dressed appropriately.  HEENT: Pupils equal, extraocular movements intact  Respiratory: Patient's speak in full sentences and does not appear short of breath  Cardiovascular: No lower extremity edema, non tender, no erythema  Skin: Warm dry intact with no signs of infection or rash on extremities or on axial skeleton.  Abdomen: Soft nontender  Neuro: Cranial nerves II through XII are intact, neurovascularly intact in all extremities with 2+ DTRs and 2+ pulses.  Lymph: No lymphadenopathy of posterior or anterior cervical chain or axillae bilaterally.  Gait normal with good balance and coordination.  MSK:  Non tender with full range of motion and good stability and symmetric strength and tone of shoulders, elbows, wrist, hip, and ankles bilaterally.   Right hamstring seems to be unremarkable.  Full strength noted.  Patient is able to do flexion extension without any significant difficulty.  Left knee has some mild positive patella grind.  Mild lateral tracking noted.  Otherwise fairly unremarkable  Left side pain is moderate to severe over the 11th rib.  Seems to be on the posterior lateral aspect not severely.  Mild possible increasing bogginess of the soft tissue in the area.  Spleen is not palpated though.   Impression and Recommendations:     This case required medical decision making of moderate complexity. The above documentation has been reviewed and is accurate and complete Judi SaaZachary M Felice Deem, DO       Note: This dictation was prepared with Dragon dictation along with smaller phrase technology. Any transcriptional errors that  result from this process are unintentional.

## 2018-08-16 ENCOUNTER — Ambulatory Visit (INDEPENDENT_AMBULATORY_CARE_PROVIDER_SITE_OTHER)
Admission: RE | Admit: 2018-08-16 | Discharge: 2018-08-16 | Disposition: A | Discharge status: Home / Self Care | Payer: BLUE CROSS/BLUE SHIELD | Source: Ambulatory Visit | Attending: Family Medicine | Admitting: Family Medicine

## 2018-08-16 ENCOUNTER — Ambulatory Visit: Payer: BLUE CROSS/BLUE SHIELD | Admitting: Family Medicine

## 2018-08-16 ENCOUNTER — Encounter: Payer: Self-pay | Admitting: Family Medicine

## 2018-08-16 VITALS — BP 120/78 | HR 62 | Ht 70.0 in | Wt 192.0 lb

## 2018-08-16 DIAGNOSIS — M25562 Pain in left knee: Secondary | ICD-10-CM

## 2018-08-16 DIAGNOSIS — M25561 Pain in right knee: Secondary | ICD-10-CM

## 2018-08-16 DIAGNOSIS — R109 Unspecified abdominal pain: Secondary | ICD-10-CM

## 2018-08-16 DIAGNOSIS — M549 Dorsalgia, unspecified: Secondary | ICD-10-CM | POA: Diagnosis not present

## 2018-08-16 DIAGNOSIS — S76301D Unspecified injury of muscle, fascia and tendon of the posterior muscle group at thigh level, right thigh, subsequent encounter: Secondary | ICD-10-CM

## 2018-08-16 DIAGNOSIS — G8929 Other chronic pain: Secondary | ICD-10-CM | POA: Diagnosis not present

## 2018-08-16 MED ORDER — GABAPENTIN 100 MG PO CAPS: 200.0000 mg | ORAL_CAPSULE | Freq: Every day | ORAL | 3 refills | Status: DC

## 2018-08-16 MED FILL — GABAPENTIN 100 MG CAPSULE: 100 | 30 days supply | Qty: 60 | Fill #0

## 2018-08-16 NOTE — Patient Instructions (Addendum)
Good to see you  Ice is your friend Gabapentin 100-200mg  at night Xrays downstairs I think you will be good to go snowboarding.  Stop the nitro  Look for [email or web address] size medium knee sleeve.  See me again in 4-5 weeks if not perfect or call if worsening

## 2018-08-16 NOTE — Assessment & Plan Note (Signed)
Patient continues to have a left-sided pain that seems to be out of proportion.  Possible nerve injury or an occult fracture of the ribs noted.  Patient will get x-rays today and see if anything would be possibly shown today.  Discussed vitamin D, icing regimen, which activities of doing which wants to avoid.  Follow-up again in 4 to 8 weeks

## 2018-08-16 NOTE — Assessment & Plan Note (Signed)
Mild patellofemoral syndrome.  Left greater than right.  Discussed compression brace, home exercises and vastus medialis strengthening.  Follow-up again in 4 to 8 weeks

## 2018-08-16 NOTE — Assessment & Plan Note (Signed)
The nearly completely healed at this time.  Discussed with patient to continue the home exercises intermittently.  Can discontinue the nitroglycerin.  Muscle relaxer has for any breakthrough.  Follow-up again in 4 to 6 weeks.

## 2018-09-13 ENCOUNTER — Encounter

## 2018-09-13 ENCOUNTER — Ambulatory Visit: Payer: BLUE CROSS/BLUE SHIELD | Admitting: Gastroenterology

## 2018-09-13 ENCOUNTER — Encounter: Payer: Self-pay | Admitting: Gastroenterology

## 2018-09-13 ENCOUNTER — Ambulatory Visit: Payer: BLUE CROSS/BLUE SHIELD | Admitting: Neurology

## 2018-09-13 VITALS — BP 100/64 | HR 70 | Ht 68.0 in | Wt 182.0 lb

## 2018-09-13 DIAGNOSIS — K219 Gastro-esophageal reflux disease without esophagitis: Secondary | ICD-10-CM | POA: Diagnosis not present

## 2018-09-13 MED ORDER — ESOMEPRAZOLE MAGNESIUM 40 MG PO CPDR: 40.0000 mg | DELAYED_RELEASE_CAPSULE | Freq: Two times a day (BID) | ORAL | 3 refills | Status: DC
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
## 2018-09-13 NOTE — Patient Instructions (Signed)
Nexium 40 mg twice a day for 8 weeks.   Use Pepcid twice a day as needed   We have given you a handout on lifestyle modifications.   If your symptoms do not improve after 5 weeks call back to schedule an endoscopy with Dr. Orvan Falconer

## 2018-09-20 ENCOUNTER — Encounter: Payer: Self-pay | Admitting: Gastroenterology

## 2019-05-09 ENCOUNTER — Encounter: Payer: Self-pay | Admitting: Family Medicine

## 2019-05-09 ENCOUNTER — Ambulatory Visit (INDEPENDENT_AMBULATORY_CARE_PROVIDER_SITE_OTHER): Payer: BLUE CROSS/BLUE SHIELD | Admitting: Family Medicine

## 2019-05-09 DIAGNOSIS — G4730 Sleep apnea, unspecified: Secondary | ICD-10-CM | POA: Insufficient documentation

## 2019-05-09 DIAGNOSIS — F900 Attention-deficit hyperactivity disorder, predominantly inattentive type: Secondary | ICD-10-CM

## 2019-05-09 DIAGNOSIS — K219 Gastro-esophageal reflux disease without esophagitis: Secondary | ICD-10-CM | POA: Diagnosis not present

## 2019-05-09 MED ORDER — AMPHETAMINE-DEXTROAMPHETAMINE 20 MG PO TABS: 20.0000 mg | ORAL_TABLET | Freq: Two times a day (BID) | ORAL | 0 refills | Status: AC

## 2019-05-09 MED ORDER — ESOMEPRAZOLE MAGNESIUM 40 MG PO CPDR: 40.0000 mg | DELAYED_RELEASE_CAPSULE | Freq: Every day | ORAL | 3 refills | Status: AC

## 2019-05-09 MED FILL — AMPHETAMINE-DEXTROAMPHETAMI: 20 | 30 days supply | Qty: 60 | Fill #0

## 2019-05-09 NOTE — Progress Notes (Signed)
Chief Complaint:  Matthew Hamilton is a 28 y.o. male who presents today for a virtual office visit with a chief complaint of sleep disordered breathing and to establish care.   Assessment/Plan:  No problem-specific Assessment & Plan notes found for this encounter.  Preventative Healthcare Patient was instructed to return soon for CPE. Health Maintenance Due  Topic Date Due  . HIV Screening  05/28/2004  . TETANUS/TDAP  08/26/2015  . INFLUENZA VACCINE  03/26/2019     Subjective:  HPI:  Sleep disordered breathing This is a follow-up for several years.  Stable over that time.  Patient's wife notes that he has significant snoring while sleeping.  He also has several episodes where he stops breathing all night.  Never feels refreshed after sleep.  Has excessive daytime somnolence.  Is interested in getting a sleep study done.  Has never had one done before.  ADHD Patient has been on Adderall for several years.  He takes very intermittently as needed.  Is tried several other ADHD medications in the past which did not work well for him.  He tolerates his medication well without side effects.  No reported chest pain, shortness of breath, or palpitations.  Medication helps him stay focused on task at work.  GERD Chronic problem.  Stable.  Uses Nexium 40 mg daily and does well with this.  ROS: Per HPI, otherwise a complete review of systems was negative.   PMH:  The following were reviewed and entered/updated in epic: Past Medical History:  Diagnosis Date  . ADHD    Patient Active Problem List   Diagnosis Date Noted  . Right hamstring injury 07/12/2018  . Left sided abdominal pain 07/12/2018  . Knee pain, bilateral 02/15/2014  . Smoker 07/01/2013  . ADHD (attention deficit hyperactivity disorder) 02/25/2012   Past Surgical History:  Procedure Laterality Date  . NO PAST SURGERIES      Family History  Problem Relation Age of Onset  . Diabetes Mother   . Diabetes  Father   . Colon cancer Neg Hx   . Stomach cancer Neg Hx     Medications- reviewed and updated Current Outpatient Medications  Medication Sig Dispense Refill  . esomeprazole (NEXIUM) 40 MG capsule Take 1 capsule (40 mg total) by mouth 2 (two) times daily before a meal. 60 capsule 3  . amphetamine-dextroamphetamine (ADDERALL) 20 MG tablet Take 20 mg by mouth daily. AS NEEDED     No current facility-administered medications for this visit.     Allergies-reviewed and updated Allergies  Allergen Reactions  . Amoxicillin   . Chlorine   . Codeine   . Penicillins     Social History   Socioeconomic History  . Marital status: Married    Spouse name: Not on file  . Number of children: Not on file  . Years of education: Not on file  . Highest education level: Not on file  Occupational History  . Not on file  Social Needs  . Financial resource strain: Not on file  . Food insecurity    Worry: Not on file    Inability: Not on file  . Transportation needs    Medical: Not on file    Non-medical: Not on file  Tobacco Use  . Smoking status: Former Smoker    Packs/day: 0.50    Years: 1.00    Pack years: 0.50    Types: Cigarettes  . Smokeless tobacco: Never Used  Substance and Sexual Activity  .  Alcohol use: Yes    Comment: Occ  . Drug use: Never  . Sexual activity: Yes    Partners: Female  Lifestyle  . Physical activity    Days per week: Not on file    Minutes per session: Not on file  . Stress: Not on file  Relationships  . Social Musicianconnections    Talks on phone: Not on file    Gets together: Not on file    Attends religious service: Not on file    Active member of club or organization: Not on file    Attends meetings of clubs or organizations: Not on file    Relationship status: Not on file  Other Topics Concern  . Not on file  Social History Narrative  . Not on file          Objective/Observations  Physical Exam: Gen: NAD, resting comfortably HEENT:  Extraocular movements intact.  No scleral icterus Cardiovascular: No peripheral edema Pulm: Normal work of breathing MSK: No digital cyanosis Skin: No rashes or lesions. Neuro: Grossly normal, moves all extremities Psych: Normal affect and thought content  Virtual Visit via Video   I connected with Salley Hewshristopher A Plott on 05/09/19 at  2:20 PM EDT by a video enabled telemedicine application and verified that I am speaking with the correct person using two identifiers. I discussed the limitations of evaluation and management by telemedicine and the availability of in person appointments. The patient expressed understanding and agreed to proceed.   Patient location: Home Provider location: [place] [facility] Persons participating in the virtual visit: Myself and Patient     Katina DegreeCaleb M. Jimmey RalphParker, MD 05/09/2019 2:11 PM

## 2019-05-09 NOTE — Assessment & Plan Note (Signed)
Database reviewed with no red flags.  Prior PCP records are available in the EMR which indicate prior prescriptions and trial of medications.  We will refill Adderall today.  Discussed potential side effects.  He will follow-up with me in 3 to 6 months for CPE.  Will need controlled substance contract.

## 2019-05-09 NOTE — Assessment & Plan Note (Signed)
Refilled Nexium 40 mg daily as needed.

## 2019-05-09 NOTE — Assessment & Plan Note (Signed)
Place referral for sleep study.

## 2019-05-30 ENCOUNTER — Ambulatory Visit: Payer: BLUE CROSS/BLUE SHIELD | Admitting: Neurology

## 2019-05-30 ENCOUNTER — Other Ambulatory Visit: Payer: Self-pay

## 2019-05-30 ENCOUNTER — Encounter: Payer: Self-pay | Admitting: Neurology

## 2019-05-30 VITALS — BP 130/70 | HR 77 | Temp 98.4°F | Ht 69.0 in | Wt 186.0 lb

## 2019-05-30 DIAGNOSIS — K219 Gastro-esophageal reflux disease without esophagitis: Secondary | ICD-10-CM | POA: Diagnosis not present

## 2019-05-30 DIAGNOSIS — G478 Other sleep disorders: Secondary | ICD-10-CM

## 2019-05-30 DIAGNOSIS — R0683 Snoring: Secondary | ICD-10-CM

## 2019-05-30 DIAGNOSIS — G4763 Sleep related bruxism: Secondary | ICD-10-CM

## 2019-05-30 DIAGNOSIS — G4730 Sleep apnea, unspecified: Secondary | ICD-10-CM

## 2019-05-30 DIAGNOSIS — Z87891 Personal history of nicotine dependence: Secondary | ICD-10-CM

## 2019-05-30 DIAGNOSIS — G4719 Other hypersomnia: Secondary | ICD-10-CM

## 2019-05-30 DIAGNOSIS — F900 Attention-deficit hyperactivity disorder, predominantly inattentive type: Secondary | ICD-10-CM | POA: Diagnosis not present

## 2019-05-30 MED ORDER — MODAFINIL 100 MG PO TABS: 100.0000 mg | ORAL_TABLET | Freq: Every day | ORAL | 0 refills | Status: AC

## 2019-05-30 NOTE — Progress Notes (Signed)
SLEEP MEDICINE CLINIC    Provider:  Melvyn Novasarmen  Marykay Mccleod, MD  Primary Care Physician:  Ardith DarkParker, Caleb M, MD [address] [place] [id number]     Referring Provider: Ardith DarkParker, Caleb M, Md [address] [place] [id number]          Chief Complaint according to patient   Patient presents with:    . New Patient (Initial Visit)     pt states that he snores in sleep and wife has witnessed apnea events.      HISTORY OF PRESENT ILLNESS:  Matthew Hamilton is a 28 y.o. year old Caucasian male patient seen upon a referral on 05/30/2019 Chief concern according to patient :  I always snored, but over the last 2 years it got so much worse that my wife can't sleep in the same room"  I have the pleasure of seeing Matthew Hamilton today, a right -handed Caucasian male with a possible sleep breathing disorder. He  has a medical history of ADHD.   The patient never had a  sleep study . Sleep relevant medical history: Nocturia- none , Sleep walking last when 28 years old . No cervical spine trauma-  concussiion times 2 or more - no deviated septum. No tonsillectomy.     Family medical /sleep history: paternal  grandfather and father with OSA.    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  and lives in a household with  persons. Family status is married , without children. 3 dogs.  The patient currently works in daytime.  Tobacco use quit in 2018, 1 p per day- for 7- 8 years.   ETOH use :seldomly- beer  , Caffeine intake in form of Coffee( 1) Soda( 4-6 / day) Tea ( seldomly ) sometimes energy drinks. Regular exercise in form of : softball team. .   Hobbies ; fishing, tinkers on cars.     Sleep habits are as follows: The patient's dinner time is between 6-7  PM. He doesn't eat later because of R GERD.  The patient goes to bed at 10 PM and continues to sleep for many hours, wakes tired no matter how long he slept. The  preferred sleep position is side or back ( loudest snoring) , with the support of 2 pillows. Dreams are reportedly rare. He doesn't know- not vivid.  5 AM is the usual rise time. The patient wakes up only with an alarm.  He eports not feeling refreshed or restored in AM, with, morning headaches, and residual fatigue. Naps are taken infrequently, lasting from 30-45  minutes and are more refreshing than nocturnal sleep.     Review of Systems: Out of a complete 14 system review, the patient complains of only the following symptoms, and all other reviewed systems are negative.:  No sleep paralysis vivid dreams, dream intrusions, but high EDS- no cataplexy. Fatigue, sleepiness , snoring.   How likely are you to doze in the following situations: 0 = not likely, 1 = slight chance, 2 = moderate chance, 3 = high chance   Sitting and Reading? Watching Television? Sitting inactive in a public place (theater or meeting)? As a passenger in a car for an hour without a break? Lying down in the afternoon when circumstances permit? Sitting and talking to someone? Sitting quietly after lunch without alcohol? In a car, while stopped for a few minutes in traffic?   Total =  17/ 24 points   FSS endorsed at 31/ 63 points.   Social History   Socioeconomic History  . Marital status: Married    Spouse name: Not on file  . Number of children: Not on file  . Years of education: Not on file  . Highest education level: Not on file  Occupational History  . Not on file  Social Needs  . Financial resource strain: Not on file  . Food insecurity    Worry: Not on file    Inability: Not on file  . Transportation needs    Medical: Not on file    Non-medical: Not on file  Tobacco Use  . Smoking status: Former Smoker    Packs/day: 0.50    Years: 1.00    Pack years: 0.50    Types: Cigarettes  . Smokeless tobacco: Never Used  Substance and Sexual Activity  . Alcohol use: Yes    Comment: Occ  . Drug use:  Never  . Sexual activity: Yes    Partners: Female  Lifestyle  . Physical activity    Days per week: Not on file    Minutes per session: Not on file  . Stress: Not on file  Relationships  . Social Herbalist on phone: Not on file    Gets together: Not on file    Attends religious service: Not on file    Active member of club or organization: Not on file    Attends meetings of clubs or organizations: Not on file    Relationship status: Not on file  Other Topics Concern  . Not on file  Social History Narrative  . Not on file    Family History  Problem Relation Age of Onset  . Diabetes Mother   . Diabetes Father   . Sleep apnea Father   . Sleep apnea Paternal Grandfather   . Colon cancer Neg Hx   . Stomach cancer Neg Hx     Past Medical History:  Diagnosis Date  . ADHD     Past Surgical History:  Procedure Laterality Date  . NO PAST SURGERIES       Current Outpatient Medications on File Prior to Visit  Medication Sig Dispense Refill  . amphetamine-dextroamphetamine (ADDERALL) 20 MG tablet Take 1 tablet (20 mg total) by mouth 2 (two) times daily. AS NEEDED 60 tablet 0  . esomeprazole (NEXIUM) 40 MG capsule Take 1 capsule (40 mg total) by mouth daily. 90 capsule 3   No current facility-administered medications on file prior to visit.     Allergies  Allergen Reactions  . Amoxicillin   . Chlorine   . Codeine   . Penicillins     Physical exam:  Today's Vitals   05/30/19 1212  BP: 130/70  Pulse: 77  Temp: 98.4 F (36.9 C)  Weight: 186 lb (84.4 kg)  Height: 5\' 9"  (1.753 m)   Body mass index is 27.47 kg/m.   Wt Readings from Last 3 Encounters:  05/30/19 186 lb (84.4 kg)  09/13/18 182 lb (82.6 kg)  08/16/18 192 lb (87.1 kg)     Ht Readings from Last 3 Encounters:  05/30/19 5\' 9"  (1.753 m)  09/13/18 5\' 8"  (1.727 m)  08/16/18 5\' 10"  (1.778 m)      General: The patient is awake, alert and appears not in acute distress. The patient is well  groomed. Head: Normocephalic, atraumatic. Neck is supple. Mallampati 3,  neck circumference:16. 75  inches .  Nasal airflow is patent.  Retrognathia is is seen.  Dental status: had retainers, ripped those out in his sleep.  Bruxism marks.  Cardiovascular:  Regular rate and cardiac rhythm by pulse,  without distended neck veins. Respiratory: Lungs are clear to auscultation.  Skin:  Without evidence of ankle edema, or rash. Trunk: The patient's posture is erect.   Neurologic exam : The patient is awake and alert, oriented to place and time.   Memory subjective described as intact.  Attention span & concentration ability appears normal.  Speech is fluent,  without  dysarthria, dysphonia or aphasia.  Mood and affect are appropriate.   Cranial nerves: no loss of smell or taste reported .  Pupils are equal and briskly reactive to light. Funduscopic exam -deferred.   Extraocular movements in vertical and horizontal planes were intact and without nystagmus. No Diplopia. Visual fields by finger perimetry are intact. Hearing was intact to soft voice and finger rubbing.  Facial sensation intact to fine touch. Facial motor strength is symmetric and tongue and uvula move midline.  Neck ROM : rotation, tilt and flexion extension were normal for age and shoulder shrug was symmetrical.    Motor exam:  Symmetric bulk, tone and ROM.   Normal tone without cog wheeling, symmetric grip strength .  Sensory:  Fine touch, pinprick and vibration were tested  and  normal.  Proprioception tested in the upper extremities was normal.  Coordination: Rapid alternating movements in the fingers/hands were of normal speed. No changes in penman ship.  The Finger-to-nose maneuver was intact without evidence of ataxia, dysmetria .  There is mild tremor, fine , low amplitude- at rest.     Gait and station: Patient could rise unassisted from a seated position, walked without assistive device.  He ascended to the exam  table.  Stance is of normal width/ base and the patient turned with 3 steps.  Toe and heel walk were deferred.  Deep tendon reflexes: in the  upper and lower extremities are symmetric and intact.- brisk   Babinski response was deferred.       After spending a total time of 40  minutes face to face and additional time for physical and neurologic examination, review of laboratory studies,  personal review of imaging studies, reports and results of other testing and review of referral information / records as far as provided in visit, I have established the following assessments:  1) Non restorative sleep associated with loud snoring, and possible apnea.   2) Headaches when waking up  3) Bruxism , no RLS.    My Plan is to proceed with:  1) HST or SPLIT study, dependent on insurance coverage.    I would like to thank Ardith Dark, MD  [address] [place],  [place] [address] for allowing me to meet with and to take care of this pleasant patient.   In short, Matthew Hamilton will  follow up with either MD personally or through our NP within 2 month.   CC: I will share my notes with PCP. Dentist is Linden Dolin, MD   Electronically signed by: Matthew Novas, MD 05/30/2019 12:15 PM  [facility] Board certified by The [employer] of Sleep Medicine and Diplomate of the [employer] of Sleep Medicine. Board certified In Neurology through the ABPN, Fellow of the [employer] of Neu[employer]. Medical Director of [facility].

## 2019-05-30 NOTE — Addendum Note (Signed)
Addended by: Larey Seat on: 05/30/2019 12:47 PM   Modules accepted: Orders

## 2019-05-30 NOTE — Patient Instructions (Addendum)
Hypersomnia Hypersomnia is a condition in which a person feels very tired during the day even though he or she gets plenty of sleep at night. A person with this condition may take naps during the day and may find it very difficult to wake up from sleep. Hypersomnia may affect a person's ability to think, concentrate, drive, or remember things. What are the causes? The cause of this condition may not be known. Possible causes include:  Certain medicines.  Sleep disorders, such as narcolepsy and sleep apnea.  Injury to the head, brain, or spinal cord.  Drug or alcohol use.  Gastroesophageal reflux disease (GERD).  Tumors.  Certain medical conditions, such as depression, diabetes, or an underactive thyroid gland (hypothyroidism). What are the signs or symptoms? The main symptoms of hypersomnia include:  Feeling very tired throughout the day, regardless of how much sleep you got the night before.  Having trouble waking up. Others may find it difficult to wake you up when you are sleeping.  Sleeping for longer and longer periods at a time.  Taking naps throughout the day. Other symptoms may include:  Feeling restless, anxious, or annoyed.  Lacking energy.  Having trouble with: ? Remembering. ? Speaking. ? Thinking.  Loss of appetite.  Seeing, hearing, tasting, smelling, or feeling things that are not real (hallucinations). How is this diagnosed? This condition may be diagnosed based on:  Your symptoms and medical history.  Your sleeping habits. Your health care provider may ask you to write down your sleeping habits in a daily sleep log, along with any symptoms you have.  A series of tests that are done while you sleep (sleep study or polysomnogram).  A test that measures how quickly you can fall asleep during the day (daytime nap study or multiple sleep latency test). How is this treated? Treatment can help you manage your condition. Treatment may include:   Following a regular sleep routine.  Lifestyle changes, such as changing your eating habits, getting regular exercise, and avoiding alcohol or caffeinated beverages.  Taking medicines to make you more alert (stimulants) during the day.  Treating any underlying medical causes of hypersomnia. Follow these instructions at home: Sleep routine   Schedule the same bedtime and wake-up time each day.  Practice a relaxing bedtime routine. This may include reading, meditation, deep breathing, or taking a warm bath before going to sleep.  Get regular exercise each day. Avoid strenuous exercise in the evening hours.  Keep your sleep environment at a cooler temperature, darkened, and quiet.  Sleep with pillows and a mattress that are comfortable and supportive.  Schedule short 20-minute naps for when you feel sleepiest during the day.  Talk with your employer or teachers about your hypersomnia. If possible, adjust your schedule so that: ? You have a regular daytime work schedule. ? You can take a scheduled nap during the day. ? You do not have to work or be active at night.  Do not eat a heavy meal for a few hours before bedtime. Eat your meals at about the same times every day.  Avoid drinking alcohol or caffeinated beverages. Safety   Do not drive or use heavy machinery if you are sleepy. Ask your health care provider if it is safe for you to drive.  Wear a life jacket when swimming or spending time near water. General instructions  Take supplements and over-the-counter and prescription medicines only as told by your health care provider.  Keep a sleep log that will help   your doctor manage your condition. This may include information about: ? What time you go to bed each night. ? How often you wake up at night. ? How many hours you sleep at night. ? How often and for how long you nap during the day. ? Any observations from others, such as leg movements during sleep, sleep walking,  or snoring.  Keep all follow-up visits as told by your health care provider. This is important. Contact a health care provider if:  You have new symptoms.  Your symptoms get worse. Get help right away if:  You have serious thoughts about hurting yourself or someone else. If you ever feel like you may hurt yourself or others, or have thoughts about taking your own life, get help right away. You can go to your nearest emergency department or call:  Your local emergency services (911 in the [place]  A suicide crisis helpline, such as the National Suicide Prevention Lifeline at 562-180-8180. This is open 24 hours a day. Summary  Hypersomnia refers to a condition in which you feel very tired during the day even though you get plenty of sleep at night.  A person with this condition may take naps during the day and may find it very difficult to wake up from sleep.  Hypersomnia may affect a person's ability to think, concentrate, drive, or remember things.  Treatment, such as following a regular sleep routine and making some lifestyle changes, can help you manage your condition. This information is not intended to replace advice given to you by your health care provider. Make sure you discuss any questions you have with your health care provider. Document Released: 08/01/2002 Document Revised: 08/13/2017 Document Reviewed: 08/13/2017 Elsevier Patient Education  2020 Elsevier Inc.   Modafinil tablets What is this medicine? MODAFINIL (moe DAF i nil) is used to treat excessive sleepiness caused by certain sleep disorders. This includes narcolepsy, sleep apnea, and shift work sleep disorder. This medicine may be used for other purposes; ask your health care provider or pharmacist if you have questions. COMMON BRAND NAME(S): Provigil What should I tell my health care provider before I take this medicine? They need to know if you have any of these conditions:  history of depression, mania,  or other mental disorder  kidney disease  liver disease  an unusual or allergic reaction to modafinil, other medicines, foods, dyes, or preservatives  pregnant or trying to get pregnant  breast-feeding How should I use this medicine? Take this medicine by mouth with a glass of water. Follow the directions on the prescription label. Take your doses at regular intervals. Do not take your medicine more often than directed. Do not stop taking except on your doctor's advice. A special MedGuide will be given to you by the pharmacist with each prescription and refill. Be sure to read this information carefully each time. Talk to your pediatrician regarding the use of this medicine in children. This medicine is not approved for use in children. Overdosage: If you think you have taken too much of this medicine contact a poison control center or emergency room at once. NOTE: This medicine is only for you. Do not share this medicine with others. What if I miss a dose? If you miss a dose, take it as soon as you can. If it is almost time for your next dose, take only that dose. Do not take double or extra doses. What may interact with this medicine? Do not take this medicine with any  of the following medications:  amphetamine or dextroamphetamine  dexmethylphenidate or methylphenidate  medicines called MAO Inhibitors like Nardil, Parnate, Marplan, Eldepryl  pemoline  procarbazine This medicine may also interact with the following medications:  antifungal medicines like itraconazole or ketoconazole  barbiturates like phenobarbital  birth control pills or other hormone-containing birth control devices or implants  carbamazepine  cyclosporine  diazepam  medicines for depression, anxiety, or psychotic disturbances  phenytoin  propranolol  triazolam  warfarin This list may not describe all possible interactions. Give your health care provider a list of all the medicines, herbs,  non-prescription drugs, or dietary supplements you use. Also tell them if you smoke, drink alcohol, or use illegal drugs. Some items may interact with your medicine. What should I watch for while using this medicine? Visit your doctor or health care provider for regular checks on your progress. The full effects of this medicine may not be seen right away. This medicine may cause serious skin reactions. They can happen weeks to months after starting the medicine. Contact your health care provider right away if you notice fevers or flu-like symptoms with a rash. The rash may be red or purple and then turn into blisters or peeling of the skin. Or, you might notice a red rash with swelling of the face, lips or lymph nodes in your neck or under your arms. This medicine may affect your concentration, function, or may hide signs that you are tired. You may get dizzy. Do not drive, use machinery, or do anything that needs mental alertness until you know how this drug affects you. Alcohol can make you more dizzy and may interfere with your response to this medicine or your alertness. Avoid alcoholic drinks. Birth control pills may not work properly while you are taking this medicine. Talk to your doctor about using an extra method of birth control. It is unknown if the effects of this medicine will be increased by the use of caffeine. Caffeine is available in many foods, beverages, and medications. Ask your doctor if you should limit or change your intake of caffeine-containing products while on this medicine. What side effects may I notice from receiving this medicine? Side effects that you should report to your doctor or health care professional as soon as possible:  allergic reactions like skin rash, itching or hives, swelling of the face, lips, or tongue  anxiety  breathing problems  chest pain  fast, irregular heartbeat  hallucinations  increased blood pressure  rash, fever, and swollen lymph  nodes  redness, blistering, peeling or loosening of the skin, including inside the mouth  sore throat, fever, or chills  suicidal thoughts or other mood changes  tremors  vomiting Side effects that usually do not require medical attention (report to your doctor or health care professional if they continue or are bothersome):  headache  nausea, diarrhea, or stomach upset  nervousness  trouble sleeping This list may not describe all possible side effects. Call your doctor for medical advice about side effects. You may report side effects to FDA at 1-800-FDA-1088. Where should I keep my medicine? Keep out of the reach of children. This medicine can be abused. Keep your medicine in a safe place to protect it from theft. Do not share this medicine with anyone. Selling or giving away this medicine is dangerous and against the law. This medicine may cause accidental overdose and death if taken by other adults, children, or pets. Mix any unused medicine with a substance like cat  litter or coffee grounds. Then throw the medicine away in a sealed container like a sealed bag or a coffee can with a lid. Do not use the medicine after the expiration date. Store at room temperature between 20 and 25 degrees C (68 and 77 degrees F). NOTE: This sheet is a summary. It may not cover all possible information. If you have questions about this medicine, talk to your doctor, pharmacist, or health care provider.  2020 Elsevier/Gold Standard (2018-11-17 10:08:08)

## 2019-06-19 ENCOUNTER — Ambulatory Visit (INDEPENDENT_AMBULATORY_CARE_PROVIDER_SITE_OTHER): Payer: BLUE CROSS/BLUE SHIELD | Admitting: Neurology

## 2019-06-19 DIAGNOSIS — Z87891 Personal history of nicotine dependence: Secondary | ICD-10-CM

## 2019-06-19 DIAGNOSIS — G4710 Hypersomnia, unspecified: Secondary | ICD-10-CM | POA: Diagnosis not present

## 2019-06-19 DIAGNOSIS — G4719 Other hypersomnia: Secondary | ICD-10-CM

## 2019-06-19 DIAGNOSIS — R0683 Snoring: Secondary | ICD-10-CM

## 2019-06-27 NOTE — Procedures (Signed)
  Patient Information     First Name: Matthew Last Name: Hamilton ID: [id number]  Birth Date: 09-06-1990 Age: 28 Gender: Male  Referring Provider: Vivi Barrack, MD BMI: 28.1 (W=189 lb, H=5' 9'')  Neck Circ.:  17 '' Epworth:  17/24   Sleep Study Information    Study Date: Jun 20, 2019 S/H/A Version: [id number] / 4.1.1528 / 18  History:    [name] is a 28 y.o. year old Caucasian male patient seen on 05/30/2019 Chief concern according to patient: "I always snored, but over the last 2 years it got so much worse that my wife can't sleep in the same room". Berline Lopes has a medical history of ADHD. He reports hypersomnia. There was no early REM onset indicated.   Summary & Diagnosis:    Mild obstructive Sleep apnea at AHI 11.8 /h without REM sleep accentuation and without significant hypoxemia. Notable was a low heart rate, mean heart rate was 56/EPP.  This uncomplicated und mild apnea can be treated with a dental device and can be addressed with CPAP as well. Epworth score should be re -addressed after either apnea treatment has been initiated.   MD Physician: Larey Seat, MD              Sleep Summary  Oxygen Saturation Statistics   Start Study Time: End Study Time: Total Recording Time:  12:03:00 AM   9:17:20 AM   9 h, 14 min  Total Sleep Time % REM of Sleep Time:  7 h, 34 min 26.3    Mean: 96 Minimum: 84 Maximum: 100  Mean of Desaturations Nadirs (%):   90  Oxygen Desaturation. %:   4-9 10-20 >20 Total  Events Number Total    45  17 72.6 27.4  0 0.0  62 100.0  Oxygen Saturation: <90 <=88 <85 <80 <70  Duration (minutes): Sleep % 2.0 0.4  1.0 0.0  0.2 0.0 0.0 0.0 0.0 0.0     Respiratory Indices      Total Events REM NREM All Night  pRDI:  140  pAHI:  89 ODI:  62  pAHIc:  8  % CSR: 0.0 20.1 11.6 6.5 1.1 18.0 11.9 8.8 1.1 18.6 11.8 8.2 1.1       Pulse Rate Statistics during Sleep (BPM)      Mean: 54 Minimum:  N/A Maximum: 91    Indices are calculated using technically valid sleep time of 7 h, 32 min. Central-Indices are calculated using technically valid sleep time of 7 h5 min. pRDI/pAHI are calculated using 02 desaturations ? 3%  Body Position Statistics  Position Supine Prone Right Left Non-Supine  Sleep (min) 131.0 52.0 161.5 59.5 273.0  Sleep % 28.8 11.4 35.5 13.1 60.0  pRDI 43.4 6.9 7.1 15.5 8.8  pAHI 36.0 0.0 2.6 3.1 2.2  ODI 27.7 0.0 0.0 2.1 0.4     Snoring Statistics Snoring Level (dB) >40 >50 >60 >70 >80 >Threshold (45)  Sleep (min) 100.7 9.5 3.9 0.0 0.0 13.1  Sleep % 22.1 2.1 0.9 0.0 0.0 2.9    Mean: 41 dB

## 2019-06-29 ENCOUNTER — Telehealth: Payer: Self-pay | Admitting: Neurology

## 2019-06-29 ENCOUNTER — Other Ambulatory Visit: Payer: Self-pay | Admitting: Neurology

## 2019-06-29 DIAGNOSIS — G478 Other sleep disorders: Secondary | ICD-10-CM

## 2019-06-29 DIAGNOSIS — G4730 Sleep apnea, unspecified: Secondary | ICD-10-CM

## 2019-06-29 DIAGNOSIS — G4719 Other hypersomnia: Secondary | ICD-10-CM

## 2019-06-29 DIAGNOSIS — G4763 Sleep related bruxism: Secondary | ICD-10-CM

## 2019-06-29 DIAGNOSIS — R0683 Snoring: Secondary | ICD-10-CM

## 2019-06-29 DIAGNOSIS — F900 Attention-deficit hyperactivity disorder, predominantly inattentive type: Secondary | ICD-10-CM

## 2019-06-29 NOTE — Telephone Encounter (Signed)
I called pt. I advised pt that Dr. Brett Fairy reviewed their sleep study results and found that has sleep apnea[. Dr. Brett Fairy recommends that pt starts auto CPAP. I reviewed PAP compliance expectations with the pt. Pt is agreeable to starting a CPAP. I advised pt that an order will be sent to a DME, [facility], and [facility] will call the pt within about one week after they file with the pt's insurance. [facility] will show the pt how to use the machine, fit for masks, and troubleshoot the CPAP if needed. A follow up appt was made for insurance pur,poses with Debbora Presto, NP on Jan 18,2021 at 2:30 pm. Pt verbalized understanding to arrive 15 minutes early and bring their CPAP. A letter with all of this information in it will be mailed to the pt as a reminder. I verified with the pt that the address we have on file is correct. Pt verbalized understanding of results. Pt had no questions at this time but was encouraged to call back if questions arise. I have sent the order to [facility] and have received confirmation that they have received the order.

## 2019-06-29 NOTE — Telephone Encounter (Signed)
-----   Message from Larey Seat, MD sent at 06/27/2019  4:34 PM EST ----- Matthew Hamilton is a 28 y.o. year old Caucasian male patient seen on 05/30/2019 Chief concern according to patient: "I always snored, but over the last 2 years it got so much worse that my wife can't sleep in the same room". Berline Lopes has a medical history of ADHD. He reports hypersomnia. There was no early REM onset indicated.   Summary & Diagnosis:   Mild obstructive Sleep apnea at AHI 11.8 /h without REM sleep accentuation and without significant hypoxemia. Notable was a low heart rate, mean heart rate was 90/WIO.  This uncomplicated und mild apnea can be treated with a dental device and can be addressed with CPAP as well. Epworth score should be re-addressed after either apnea treatment has been initiated.  Please let me know if the patient prefers CPAP or dental device, I will refer accordingly.

## 2019-09-12 ENCOUNTER — Ambulatory Visit: Payer: Self-pay | Admitting: Family Medicine

## 2019-09-12 ENCOUNTER — Encounter: Payer: Self-pay | Admitting: Family Medicine

## 2019-09-14 DIAGNOSIS — Z0289 Encounter for other administrative examinations: Secondary | ICD-10-CM

## 2019-12-01 ENCOUNTER — Emergency Department (HOSPITAL_COMMUNITY): Payer: 59

## 2019-12-01 ENCOUNTER — Emergency Department (HOSPITAL_COMMUNITY)
Admission: EM | Admit: 2019-12-01 | Discharge: 2019-12-02 | Disposition: A | Discharge status: Home / Self Care | Payer: 59 | Attending: Emergency Medicine | Admitting: Emergency Medicine

## 2019-12-01 ENCOUNTER — Other Ambulatory Visit: Payer: Self-pay

## 2019-12-01 ENCOUNTER — Encounter (HOSPITAL_COMMUNITY): Payer: Self-pay

## 2019-12-01 DIAGNOSIS — S8012XA Contusion of left lower leg, initial encounter: Secondary | ICD-10-CM

## 2019-12-01 DIAGNOSIS — S7012XA Contusion of left thigh, initial encounter: Secondary | ICD-10-CM | POA: Diagnosis not present

## 2019-12-01 DIAGNOSIS — Z87891 Personal history of nicotine dependence: Secondary | ICD-10-CM | POA: Diagnosis not present

## 2019-12-01 DIAGNOSIS — S70922A Unspecified superficial injury of left thigh, initial encounter: Secondary | ICD-10-CM | POA: Diagnosis present

## 2019-12-01 DIAGNOSIS — Y999 Unspecified external cause status: Secondary | ICD-10-CM | POA: Insufficient documentation

## 2019-12-01 DIAGNOSIS — T148XXA Other injury of unspecified body region, initial encounter: Secondary | ICD-10-CM

## 2019-12-01 DIAGNOSIS — Y9389 Activity, other specified: Secondary | ICD-10-CM | POA: Insufficient documentation

## 2019-12-01 DIAGNOSIS — Z79899 Other long term (current) drug therapy: Secondary | ICD-10-CM | POA: Insufficient documentation

## 2019-12-01 DIAGNOSIS — R52 Pain, unspecified: Secondary | ICD-10-CM

## 2019-12-01 DIAGNOSIS — F909 Attention-deficit hyperactivity disorder, unspecified type: Secondary | ICD-10-CM | POA: Insufficient documentation

## 2019-12-01 DIAGNOSIS — Z23 Encounter for immunization: Secondary | ICD-10-CM | POA: Insufficient documentation

## 2019-12-01 DIAGNOSIS — Y9259 Other trade areas as the place of occurrence of the external cause: Secondary | ICD-10-CM | POA: Diagnosis not present

## 2019-12-01 MED ORDER — LIDOCAINE HCL (PF) 1 % IJ SOLN
30.0000 mL | Freq: Once | INTRAMUSCULAR | Status: DC
  Filled 2019-12-01: qty 30

## 2019-12-01 MED ORDER — TETANUS-DIPHTH-ACELL PERTUSSIS 5-2.5-18.5 LF-MCG/0.5 IM SUSP
0.5000 mL | Freq: Once | INTRAMUSCULAR | Status: AC
  Administered 2019-12-02: 0.5 mL via INTRAMUSCULAR
  Filled 2019-12-01 (×2): qty 0.5
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
## 2019-12-02 MED ORDER — HYDROCODONE-ACETAMINOPHEN 5-325 MG PO TABS: 1.0000 | ORAL_TABLET | Freq: Four times a day (QID) | ORAL | 0 refills | Status: AC | PRN

## 2019-12-02 NOTE — ED Notes (Signed)
Pt. Refused knee immoblizer, CN made aware.

## 2019-12-02 NOTE — ED Notes (Signed)
Patient refused Knee immobilizer

## 2019-12-02 NOTE — Discharge Instructions (Signed)
1. Medications: alternate ibuprofen/naprosyn and tylenol for pain control, Vicodin only for severe breakthrough pain; usual home medications 2. Treatment: rest, ice, elevate and use brace, drink plenty of fluids, gentle stretching 3. Follow Up: Please followup with orthopedics in 1 week if no improvement for discussion of your diagnoses and further evaluation after today's visit; Please return to the ER for worsening symptoms or other concerns

## 2020-01-10 MED FILL — SULFAMETHOXAZOLE-TMP DS TAB: 800-160 | 5 days supply | Qty: 10 | Fill #0

## 2020-09-26 IMAGING — CR DG FEMUR 2+V*L*
5 series · 5 of 5 positions shown · non-contrast
Comparison: None.

CLINICAL DATA: Pain

EXAM:
LEFT FEMUR 2 VIEWS

[t femur proximal ap left]
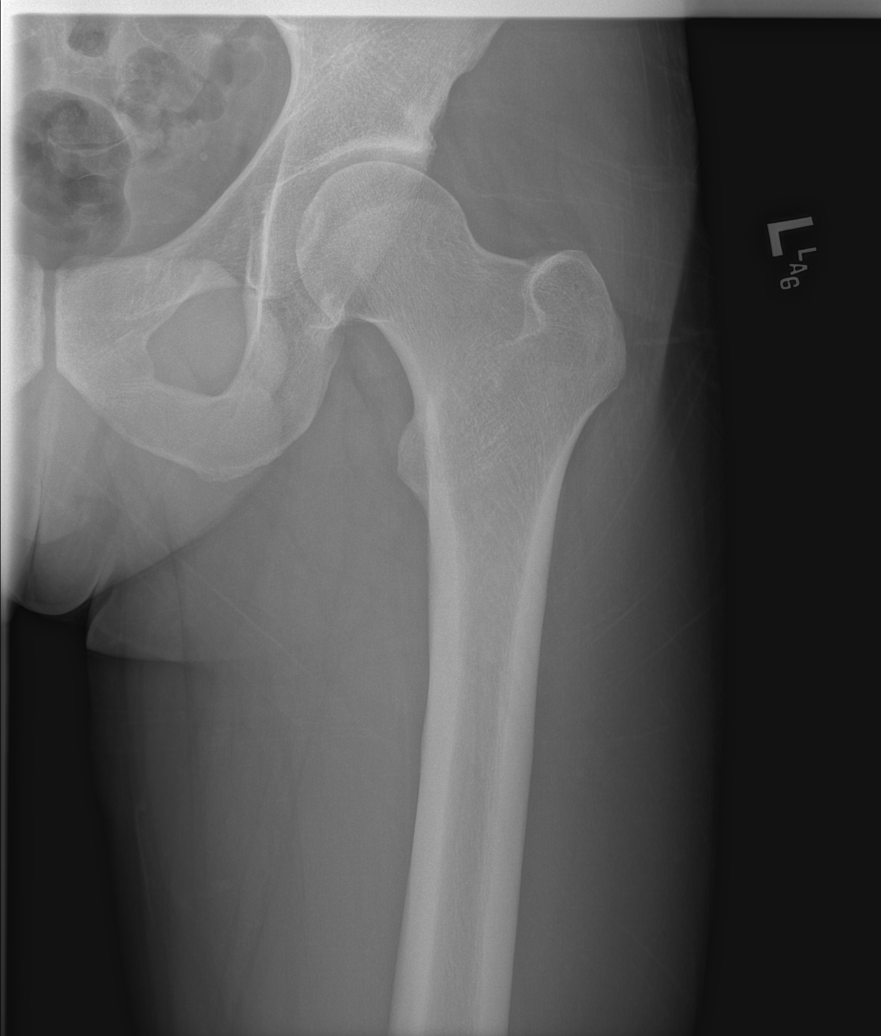

[t femur distal ap left (1 of 2)]
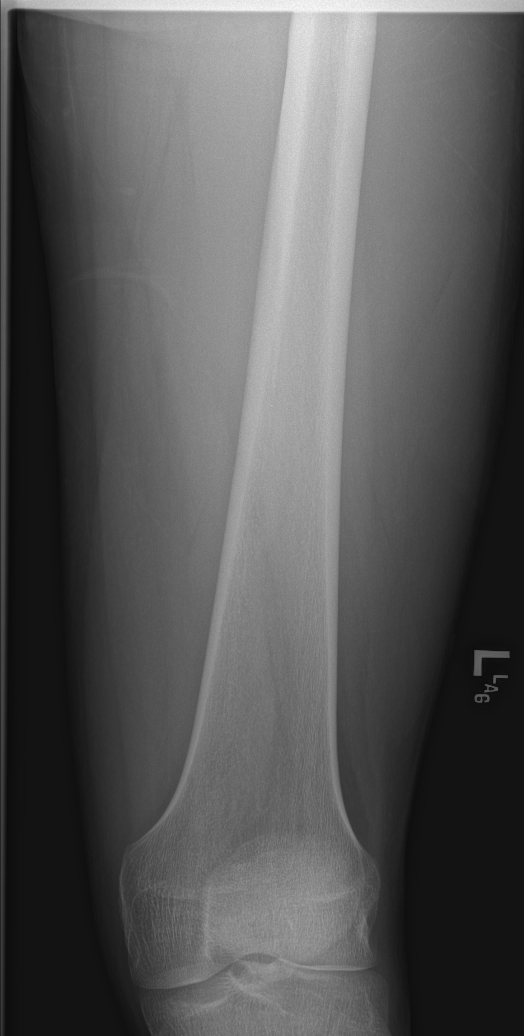

[t femur distal lat left]
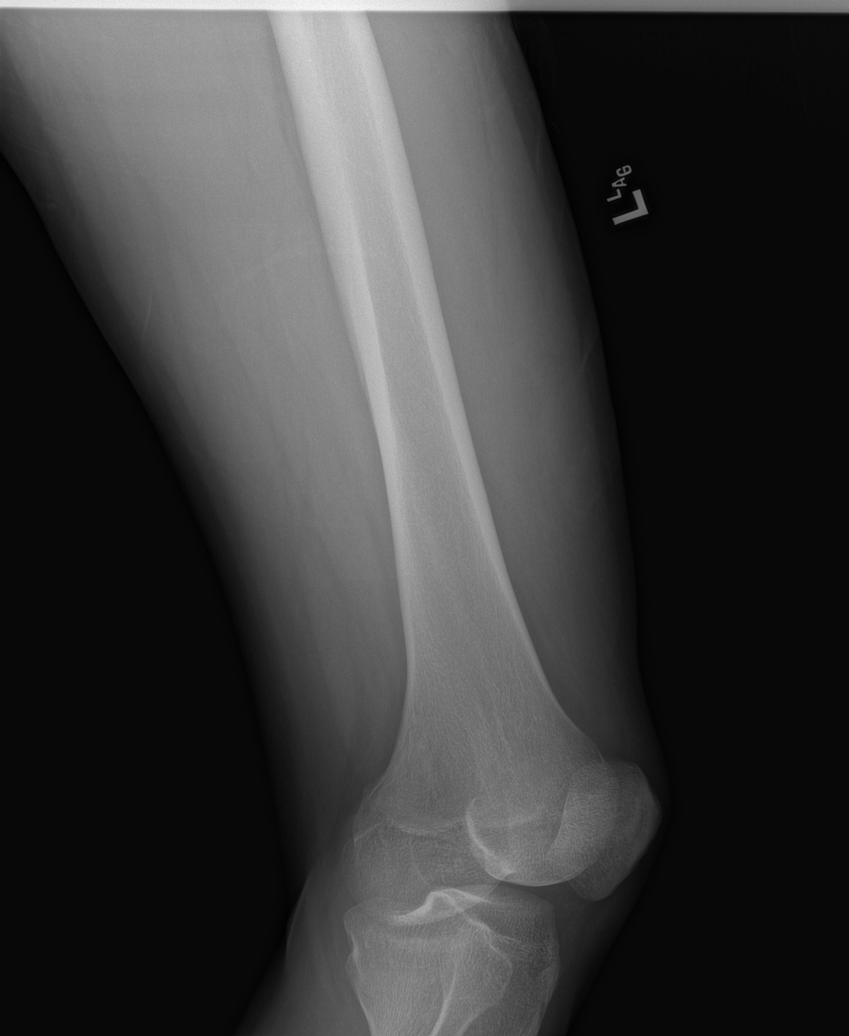

[t femur proximal lat left]
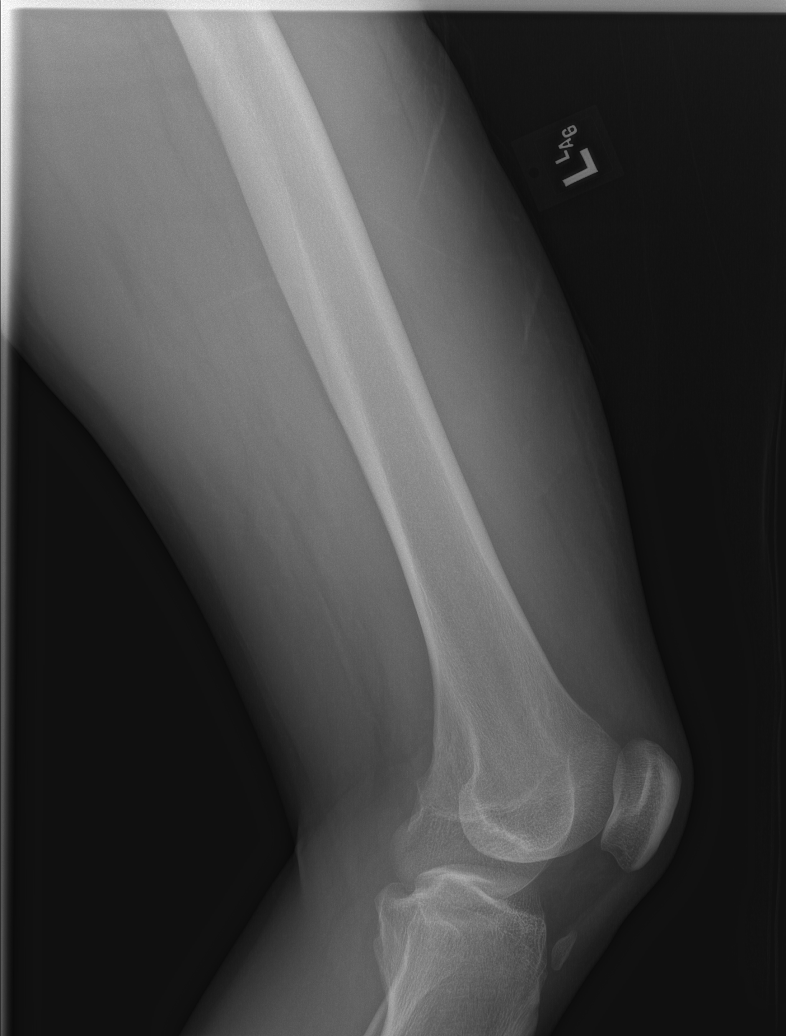

[t femur distal ap left (2 of 2)]
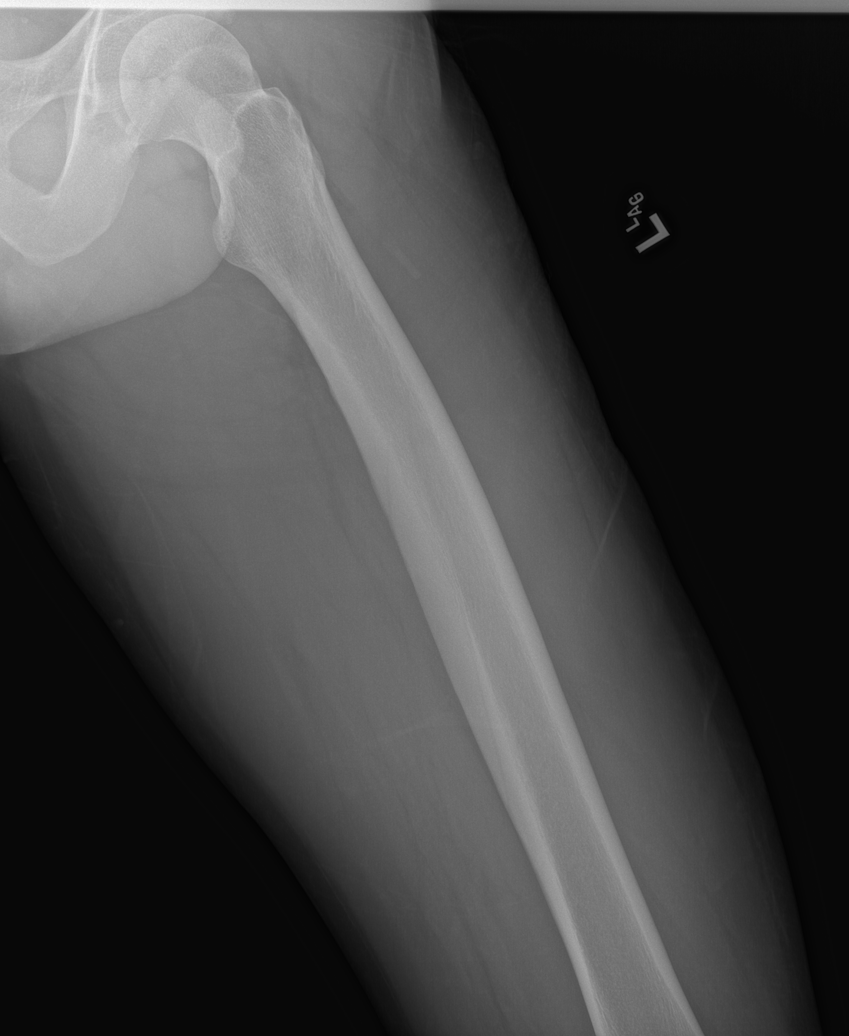

[5 of 5 positions shown; findings below may reference images not displayed]

FINDINGS: There is no evidence of fracture or other focal bone lesions. Soft
tissues are unremarkable.
IMPRESSION: Negative.
